# Patient Record
Sex: Female | Born: 1975 | Hispanic: No | Smoking: Never smoker
Health system: Southern US, Community
[De-identification: ages and names within clinical notes are randomized; demographics above are authoritative.]

## PROBLEM LIST (undated history)

## (undated) DIAGNOSIS — R141 Gas pain: Secondary | ICD-10-CM

## (undated) HISTORY — PX: CHOLECYSTECTOMY: SHX55

## (undated) HISTORY — DX: Gas pain: R14.1

---

## 2009-07-10 HISTORY — PX: GASTRIC BYPASS: SHX52

## 2015-10-30 DIAGNOSIS — Z8781 Personal history of (healed) traumatic fracture: Secondary | ICD-10-CM | POA: Diagnosis not present

## 2015-10-30 DIAGNOSIS — Z3202 Encounter for pregnancy test, result negative: Secondary | ICD-10-CM | POA: Insufficient documentation

## 2015-10-30 DIAGNOSIS — Z87828 Personal history of other (healed) physical injury and trauma: Secondary | ICD-10-CM | POA: Diagnosis not present

## 2015-10-30 DIAGNOSIS — M5441 Lumbago with sciatica, right side: Secondary | ICD-10-CM | POA: Insufficient documentation

## 2015-10-30 DIAGNOSIS — M545 Low back pain: Secondary | ICD-10-CM | POA: Diagnosis present

## 2015-10-31 ENCOUNTER — Emergency Department (HOSPITAL_COMMUNITY)
Admission: EM | Admit: 2015-10-31 | Discharge: 2015-10-31 | Disposition: A | Discharge status: Home / Self Care | Payer: Medicaid Other | Attending: Emergency Medicine | Admitting: Emergency Medicine

## 2015-10-31 ENCOUNTER — Encounter (HOSPITAL_COMMUNITY): Payer: Self-pay | Admitting: Emergency Medicine

## 2015-10-31 DIAGNOSIS — M5441 Lumbago with sciatica, right side: Secondary | ICD-10-CM

## 2015-10-31 LAB — POC URINE PREG, ED: Preg Test, Ur: NEGATIVE

## 2015-10-31 MED ORDER — CYCLOBENZAPRINE HCL 10 MG PO TABS: 10.0000 mg | ORAL_TABLET | Freq: Two times a day (BID) | ORAL | Status: DC | PRN

## 2015-10-31 MED ORDER — NAPROXEN 375 MG PO TABS: 375.0000 mg | ORAL_TABLET | Freq: Two times a day (BID) | ORAL | Status: DC

## 2015-10-31 NOTE — ED Notes (Signed)
Pt departed in NAD.  
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
## 2015-10-31 NOTE — ED Notes (Signed)
Pt. reports chronic low back pain onset November last year denies recent injury or fall .

## 2015-10-31 NOTE — Discharge Instructions (Signed)
Please read and follow all provided instructions.  Your diagnoses today include:  1. Right-sided low back pain with right-sided sciatica     Tests performed today include:  Vital signs - see below for your results today  Medications prescribed:   Take any prescribed medications only as directed.  Home care instructions:   Follow any educational materials contained in this packet  Please rest, use ice or heat on your back for the next several days  Do not lift, push, pull anything more than 10 pounds for the next week  Follow-up instructions: Please follow-up with your primary care provider in the next 1 week for further evaluation of your symptoms. I have provided a number for an orthopedic surgeon you can call for an appointment.   Return instructions:  SEEK IMMEDIATE MEDICAL ATTENTION IF YOU HAVE:  New numbness, tingling, weakness, or problem with the use of your arms or legs  Severe back pain not relieved with medications  Loss control of your bowels or bladder  Increasing pain in any areas of the body (such as chest or abdominal pain)  Shortness of breath, dizziness, or fainting.   Worsening nausea (feeling sick to your stomach), vomiting, fever, or sweats  Any other emergent concerns regarding your health   Additional Information:  Your vital signs today were: BP 108/80 mmHg   Pulse 77   Temp(Src) 98.3 F (36.8 C) (Oral)   Resp 20   Ht 5\' 2"  (1.575 m)   Wt 83.122 kg   BMI 33.51 kg/m2   SpO2 100%   LMP 10/23/2015 If your blood pressure (BP) was elevated above 135/85 this visit, please have this repeated by your doctor within one month. --------------

## 2015-11-02 ENCOUNTER — Encounter: Payer: Self-pay | Admitting: Clinical

## 2015-11-02 ENCOUNTER — Ambulatory Visit: Payer: Medicaid Other | Attending: Internal Medicine | Admitting: Internal Medicine

## 2015-11-02 ENCOUNTER — Encounter: Payer: Self-pay | Admitting: Internal Medicine

## 2015-11-02 VITALS — BP 114/78 | HR 76 | Temp 97.9°F | Wt 182.2 lb

## 2015-11-02 DIAGNOSIS — Z79899 Other long term (current) drug therapy: Secondary | ICD-10-CM | POA: Diagnosis not present

## 2015-11-02 DIAGNOSIS — M5440 Lumbago with sciatica, unspecified side: Secondary | ICD-10-CM

## 2015-11-02 DIAGNOSIS — M5441 Lumbago with sciatica, right side: Secondary | ICD-10-CM | POA: Diagnosis present

## 2015-11-02 LAB — CBC WITH DIFFERENTIAL/PLATELET
BASOS PCT: 0 %
Basophils Absolute: 0 cells/uL (ref 0–200)
EOS PCT: 0 %
Eosinophils Absolute: 0 cells/uL — ABNORMAL LOW (ref 15–500)
HCT: 33.5 % — ABNORMAL LOW (ref 35.0–45.0)
Hemoglobin: 9.8 g/dL — ABNORMAL LOW (ref 11.7–15.5)
LYMPHS PCT: 45 %
Lymphs Abs: 2475 cells/uL (ref 850–3900)
MCH: 23 pg — ABNORMAL LOW (ref 27.0–33.0)
MCHC: 29.3 g/dL — AB (ref 32.0–36.0)
MCV: 78.6 fL — ABNORMAL LOW (ref 80.0–100.0)
MONO ABS: 495 {cells}/uL (ref 200–950)
MPV: 10.1 fL (ref 7.5–12.5)
Monocytes Relative: 9 %
NEUTROS PCT: 46 %
Neutro Abs: 2530 cells/uL (ref 1500–7800)
Platelets: 373 10*3/uL (ref 140–400)
RBC: 4.26 MIL/uL (ref 3.80–5.10)
RDW: 15.8 % — AB (ref 11.0–15.0)
WBC: 5.5 10*3/uL (ref 3.8–10.8)

## 2015-11-02 LAB — BASIC METABOLIC PANEL WITH GFR
BUN: 10 mg/dL (ref 7–25)
CALCIUM: 9.3 mg/dL (ref 8.6–10.2)
CO2: 25 mmol/L (ref 20–31)
Chloride: 105 mmol/L (ref 98–110)
Creat: 0.87 mg/dL (ref 0.50–1.10)
GFR, EST NON AFRICAN AMERICAN: 83 mL/min (ref >=60)
Glucose, Bld: 89 mg/dL (ref 65–99)
Potassium: 4.6 mmol/L (ref 3.5–5.3)
SODIUM: 137 mmol/L (ref 135–146)

## 2015-11-02 LAB — HEMOGLOBIN A1C
HEMOGLOBIN A1C: 5 % (ref <5.7)
Mean Plasma Glucose: 97 mg/dL

## 2015-11-02 LAB — TSH: TSH: 0.85 m[IU]/L
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
## 2015-11-02 NOTE — Progress Notes (Signed)
[facility] 2/9 11/02/2015  Decreased Interest 2  PHQ - 2 Score 2  Altered sleeping 3  Tired, decreased energy 3  Change in appetite 3  Feeling bad or failure about yourself  2  Trouble concentrating 2  Moving slowly or fidgety/restless 2  Suicidal thoughts 0  PHQ-9 Score 17  Difficult doing work/chores Somewhat difficult    GAD 7 : Generalized Anxiety Score 11/02/2015  Nervous, Anxious, on Edge 1  Control/stop worrying 1  Worry too much - different things 2  Trouble relaxing 2  Restless 2  Easily annoyed or irritable 1  Afraid - awful might happen 0  Total GAD 7 Score 9  Anxiety Difficulty Not difficult at all   *PHQ9 total score is 19 (also 2 for "little interest or pleasure in doing things"

## 2015-11-02 NOTE — Patient Instructions (Signed)
Make appt w/  Me in couple wks for papsmear/fasting  Please - to chk cholesterol - referral specialist will call you w/ appts for pain specialist and ortho.  Back Pain, Adult Back pain is very common in adults.The cause of back pain is rarely dangerous and the pain often gets better over time.The cause of your back pain may not be known. Some common causes of back pain include: 1. Strain of the muscles or ligaments supporting the spine. 2. Wear and tear (degeneration) of the spinal disks. 3. Arthritis. 4. Direct injury to the back. For many people, back pain may return. Since back pain is rarely dangerous, most people can learn to manage this condition on their own. HOME CARE INSTRUCTIONS Watch your back pain for any changes. The following actions may help to lessen any discomfort you are feeling: 1. Remain active. It is stressful on your back to sit or stand in one place for long periods of time. Do not sit, drive, or stand in one place for more than 30 minutes at a time. Take short walks on even surfaces as soon as you are able.Try to increase the length of time you walk each day. 2. Exercise regularly as directed by your health care provider. Exercise helps your back heal faster. It also helps avoid future injury by keeping your muscles strong and flexible. 3. Do not stay in bed.Resting more than 1-2 days can delay your recovery. 4. Pay attention to your body when you bend and lift. The most comfortable positions are those that put less stress on your recovering back. Always use proper lifting techniques, including: 1. Bending your knees. 2. Keeping the load close to your body. 3. Avoiding twisting. 5. Find a comfortable position to sleep. Use a firm mattress and lie on your side with your knees slightly bent. If you lie on your back, put a pillow under your knees. 6. Avoid feeling anxious or stressed.Stress increases muscle tension and can worsen back pain.It is important to recognize  when you are anxious or stressed and learn ways to manage it, such as with exercise. 7. Take medicines only as directed by your health care provider. Over-the-counter medicines to reduce pain and inflammation are often the most helpful.Your health care provider may prescribe muscle relaxant drugs.These medicines help dull your pain so you can more quickly return to your normal activities and healthy exercise. 8. Apply ice to the injured area: 1. Put ice in a plastic bag. 2. Place a towel between your skin and the bag. 3. Leave the ice on for 20 minutes, 2-3 times a day for the first 2-3 days. After that, ice and heat may be alternated to reduce pain and spasms. 9. Maintain a healthy weight. Excess weight puts extra stress on your back and makes it difficult to maintain good posture. SEEK MEDICAL CARE IF: 1. You have pain that is not relieved with rest or medicine. 2. You have increasing pain going down into the legs or buttocks. 3. You have pain that does not improve in one week. 4. You have night pain. 5. You lose weight. 6. You have a fever or chills. SEEK IMMEDIATE MEDICAL CARE IF:  1. You develop new bowel or bladder control problems. 2. You have unusual weakness or numbness in your arms or legs. 3. You develop nausea or vomiting. 4. You develop abdominal pain. 5. You feel faint.   This information is not intended to replace advice given to you by your health care provider.  Make sure you discuss any questions you have with your health care provider.   Document Released: 06/26/2005 Document Revised: 07/17/2014 Document Reviewed: 10/28/2013 Elsevier Interactive Patient Education 2016 [employer]  - Back Exercises If you have pain in your back, do these exercises 2-3 times each day or as told by your doctor. When the pain goes away, do the exercises once each day, but repeat the steps more times for each exercise (do more repetitions). If you do not have pain in your back, do these  exercises once each day or as told by your doctor. EXERCISES Single Knee to Chest Do these steps 3-5 times in a row for each leg: 5. Lie on your back on a firm bed or the floor with your legs stretched out. 6. Bring one knee to your chest. 7. Hold your knee to your chest by grabbing your knee or thigh. 8. Pull on your knee until you feel a gentle stretch in your lower back. 9. Keep doing the stretch for 10-30 seconds. 10. Slowly let go of your leg and straighten it. Pelvic Tilt Do these steps 5-10 times in a row: 10. Lie on your back on a firm bed or the floor with your legs stretched out. 11. Bend your knees so they point up to the ceiling. Your feet should be flat on the floor. 12. Tighten your lower belly (abdomen) muscles to press your lower back against the floor. This will make your tailbone point up to the ceiling instead of pointing down to your feet or the floor. 13. Stay in this position for 5-10 seconds while you gently tighten your muscles and breathe evenly. Cat-Cow Do these steps until your lower back bends more easily: 7. Get on your hands and knees on a firm surface. Keep your hands under your shoulders, and keep your knees under your hips. You may put padding under your knees. 8. Let your head hang down, and make your tailbone point down to the floor so your lower back is round like the back of a cat. 9. Stay in this position for 5 seconds. 10. Slowly lift your head and make your tailbone point up to the ceiling so your back hangs low (sags) like the back of a cow. 11. Stay in this position for 5 seconds. Press-Ups Do these steps 5-10 times in a row: 6. Lie on your belly (face-down) on the floor. 7. Place your hands near your head, about shoulder-width apart. 8. While you keep your back relaxed and keep your hips on the floor, slowly straighten your arms to raise the top half of your body and lift your shoulders. Do not use your back muscles. To make yourself more  comfortable, you may change where you place your hands. 9. Stay in this position for 5 seconds. 10. Slowly return to lying flat on the floor. Bridges Do these steps 10 times in a row: 1. Lie on your back on a firm surface. 2. Bend your knees so they point up to the ceiling. Your feet should be flat on the floor. 3. Tighten your butt muscles and lift your butt off of the floor until your waist is almost as high as your knees. If you do not feel the muscles working in your butt and the back of your thighs, slide your feet 1-2 inches farther away from your butt. 4. Stay in this position for 3-5 seconds. 5. Slowly lower your butt to the floor, and let your butt muscles relax. If  this exercise is too easy, try doing it with your arms crossed over your chest. Belly Crunches Do these steps 5-10 times in a row: 1. Lie on your back on a firm bed or the floor with your legs stretched out. 2. Bend your knees so they point up to the ceiling. Your feet should be flat on the floor. 3. Cross your arms over your chest. 4. Tip your chin a little bit toward your chest but do not bend your neck. 5. Tighten your belly muscles and slowly raise your chest just enough to lift your shoulder blades a tiny bit off of the floor. 6. Slowly lower your chest and your head to the floor. Back Lifts Do these steps 5-10 times in a row: 1. Lie on your belly (face-down) with your arms at your sides, and rest your forehead on the floor. 2. Tighten the muscles in your legs and your butt. 3. Slowly lift your chest off of the floor while you keep your hips on the floor. Keep the back of your head in line with the curve in your back. Look at the floor while you do this. 4. Stay in this position for 3-5 seconds. 5. Slowly lower your chest and your face to the floor. GET HELP IF:  Your back pain gets a lot worse when you do an exercise.  Your back pain does not lessen 2 hours after you exercise. If you have any of these  problems, stop doing the exercises. Do not do them again unless your doctor says it is okay. GET HELP RIGHT AWAY IF:  You have sudden, very bad back pain. If this happens, stop doing the exercises. Do not do them again unless your doctor says it is okay.   This information is not intended to replace advice given to you by your health care provider. Make sure you discuss any questions you have with your health care provider.   Document Released: 07/29/2010 Document Revised: 03/17/2015 Document Reviewed: 08/20/2014 Elsevier Interactive Patient Education [employer] Else[employer] Inc[employer] -

## 2015-11-03 ENCOUNTER — Other Ambulatory Visit: Payer: Self-pay | Admitting: Internal Medicine

## 2015-11-03 DIAGNOSIS — M5441 Lumbago with sciatica, right side: Secondary | ICD-10-CM

## 2015-11-03 DIAGNOSIS — M5442 Lumbago with sciatica, left side: Principal | ICD-10-CM

## 2015-11-03 LAB — VITAMIN D 25 HYDROXY (VIT D DEFICIENCY, FRACTURES): VIT D 25 HYDROXY: 12 ng/mL — AB (ref 30–100)

## 2015-11-03 MED ORDER — VITAMIN D (ERGOCALCIFEROL) 1.25 MG (50000 UNIT) PO CAPS: 50000.0000 [IU] | ORAL_CAPSULE | ORAL | Status: DC

## 2015-11-03 MED ORDER — FERROUS GLUCONATE 324 (38 FE) MG PO TABS: 324.0000 mg | ORAL_TABLET | Freq: Two times a day (BID) | ORAL | Status: DC

## 2015-11-08 ENCOUNTER — Telehealth: Payer: Self-pay | Admitting: Internal Medicine

## 2015-11-08 NOTE — Telephone Encounter (Signed)
Patient states PCP was referring patient to ortho and Pain Clinic..patient states she is still in pain....please follow up

## 2015-11-15 ENCOUNTER — Ambulatory Visit (HOSPITAL_BASED_OUTPATIENT_CLINIC_OR_DEPARTMENT_OTHER): Payer: Medicaid Other | Admitting: Internal Medicine

## 2015-11-15 ENCOUNTER — Encounter: Payer: Self-pay | Admitting: Internal Medicine

## 2015-11-15 ENCOUNTER — Ambulatory Visit (HOSPITAL_COMMUNITY)
Admission: RE | Admit: 2015-11-15 | Discharge: 2015-11-15 | Disposition: A | Discharge status: Home / Self Care | Payer: Medicaid Other | Source: Ambulatory Visit | Attending: Internal Medicine | Admitting: Internal Medicine

## 2015-11-15 VITALS — BP 103/69 | HR 78 | Temp 98.2°F | Resp 18 | Ht 62.0 in | Wt 183.6 lb

## 2015-11-15 DIAGNOSIS — M5441 Lumbago with sciatica, right side: Secondary | ICD-10-CM | POA: Diagnosis not present

## 2015-11-15 DIAGNOSIS — D509 Iron deficiency anemia, unspecified: Secondary | ICD-10-CM

## 2015-11-15 DIAGNOSIS — M4856XA Collapsed vertebra, not elsewhere classified, lumbar region, initial encounter for fracture: Secondary | ICD-10-CM | POA: Diagnosis not present

## 2015-11-15 DIAGNOSIS — R29890 Loss of height: Secondary | ICD-10-CM | POA: Diagnosis not present

## 2015-11-15 MED ORDER — DICLOFENAC SODIUM 1 % TD GEL: 2.0000 g | Freq: Four times a day (QID) | TRANSDERMAL | Status: DC

## 2015-11-15 NOTE — Progress Notes (Signed)
Patient is here for FU  Patient complains of lower back pain being present for months. Scaled currently at a 9. Pain is described as throbbing and burning.  Patient has not taken medication today. Patient has not eaten.

## 2015-11-15 NOTE — Progress Notes (Signed)
Deborah Hogan, is a 40 y.o. female  [id number]  [id number]  DOB - [birth year]  Chief Complaint  Patient presents with  . Follow-up        Subjective:   Deborah Hogan is a 40 y.o. female here today for a follow up visit for back pain.  Quite uncomfortable at night w/ lower back pain, has difficulty sleeping, meds are not helping, trying warm heating pad at night to get rest.  Pain described constant in low back, w/ radiation/"burning" down right leg.  Denies loss of urine/stool.  Has been taking her iron pills tid, no signs of constipation.  Currently on her menses, so unable to do papsmear.   Patient has No headache, No chest pain, No abdominal pain - No Nausea, No new weakness tingling or numbness, No Cough - SOB.  Problem  Microcytic Anemia  Midline Low Back Pain With Right-Sided Sciatica    ALLERGIES: No Known Allergies  PAST MEDICAL HISTORY: No past medical history on file.  MEDICATIONS AT HOME: Prior to Admission medications   Medication Sig Start Date End Date Taking? Authorizing Provider  cyclobenzaprine (FLEXERIL) 10 MG tablet Take 1 tablet (10 mg total) by mouth 2 (two) times daily as needed for muscle spasms. Patient not taking: Reported on 11/02/2015 10/31/15   Audry Pili, PA-C  diclofenac sodium (VOLTAREN) 1 % GEL Apply 2 g topically 4 (four) times daily. 11/15/15   Pete Glatter, MD  ferrous gluconate (FERGON) 324 MG tablet Take 1 tablet (324 mg total) by mouth 2 (two) times daily with a meal. 11/03/15   Pete Glatter, MD  naproxen (NAPROSYN) 375 MG tablet Take 1 tablet (375 mg total) by mouth 2 (two) times daily with a meal. Patient not taking: Reported on 11/02/2015 10/31/15   Audry Pili, PA-C  Vitamin D, Ergocalciferol, (DRISDOL) 50000 units CAPS capsule Take 1 capsule (50,000 Units total) by mouth every 7 (seven) days. 11/03/15   Pete Glatter, MD     Objective:   Filed Vitals:   11/15/15 0934  BP: 103/69  Pulse: 78  Temp: 98.2 F  (36.8 C)  TempSrc: Oral  Resp: 18  Height:  (1.575 m)  Weight: 183 lb 9.6 oz (83.28 kg)  SpO2: 100%    Exam General appearance : Awake, alert, not in any distress. Speech Clear. Not toxic looking, morbid obese. HEENT: Atraumatic and Normocephalic, pupils equally reactive to light. Neck: supple, no JVD. Chest: Good air entry bilaterally, no added sounds. CVS: S1 S2 regular, no murmurs/gallups or rubs. Abdomen: Bowel sounds active, Non tender and not distended with no gaurding, rigidity or rebound. MS: ttp low lumbar region, l10, bilat paraspinus w/ reproducible sciatic pain down left leg.   Extremities: B/L Lower Ext shows no edema, both legs are warm to touch Neurology: Awake alert, and oriented X 3, CN II-XII grossly intact, Non focal Skin:No Rash  Data Review Lab Results  Component Value Date   HGBA1C 5.0 11/02/2015    Depression screen PHQ 2/9 11/02/2015  Decreased Interest 2  PHQ - 2 Score 2  Altered sleeping 3  Tired, decreased energy 3  Change in appetite 3  Feeling bad or failure about yourself  2  Trouble concentrating 2  Moving slowly or fidgety/restless 2  Suicidal thoughts 0  PHQ-9 Score 17  Difficult doing work/chores Somewhat difficult      Assessment & Plan   1. Microcytic anemia - currently on iron tid, encouraged to continue. - Iron, TIBC and  Ferritin Panel  2. Midline low back pain with right-sided sciatica - ortho and pain magmt referrals have been approved, pending appt times. - DG Lumbar Spine Complete; Future - today. - back stretches /info on sciatica given  3. papsmear - next time, currently on menses.  Patient have been counseled extensively about nutrition and exercise  Return in about 3 months (around 02/15/2016), or if symptoms worsen or fail to improve. pending papsmear  The patient was given clear instructions to go to ER or return to medical center if symptoms don't improve, worsen or new problems develop. The patient  verbalized understanding. The patient was told to call to get lab results if they haven't heard anything in the next week.    Pete Glatterawn T Kylar Leonhardt, MD, MBA/MHA [facility] [place], [place] [phone]   11/15/2015, 9:34 AM

## 2015-11-15 NOTE — Patient Instructions (Addendum)
Iron Deficiency Anemia, Adult Anemia is when you have a low number of healthy red blood cells. It is often caused by too little iron. This is called iron deficiency anemia. It may make you tired and short of breath. HOME CARE  1. Take iron as told by your doctor. 2. Take vitamins as told by your doctor. 3. Eat foods that have iron in them. This includes liver, lean beef, whole-grain bread, eggs, dried fruit, and dark green leafy vegetables. GET HELP RIGHT AWAY IF: 1. You pass out (faint). 2. You have chest pain. 3. You feel sick to your stomach (nauseous) or throw up (vomit). 4. You get very short of breath with activity. 5. You are weak. 6. You have a fast heartbeat. 7. You start to sweat for no reason. 8. You become light-headed when getting up from a chair or bed. MAKE SURE YOU: 1. Understand these instructions. 2. Will watch your condition. 3. Will get help right away if you are not doing well or get worse.   This information is not intended to replace advice given to you by your health care provider. Make sure you discuss any questions you have with your health care provider.   Document Released: 07/29/2010 Document Revised: 07/17/2014 Document Reviewed: 03/03/2013 Elsevier Interactive Patient Education 2016 Elsevier Inc.  - Sciatica Sciatica is pain, weakness, numbness, or tingling along your sciatic nerve. The nerve starts in the lower back and runs down the back of each leg. Nerve damage or certain conditions pinch or put pressure on the sciatic nerve. This causes the pain, weakness, and other discomforts of sciatica. HOME CARE  4. Only take medicine as told by your doctor. 5. Apply ice to the affected area for 20 minutes. Do this 3-4 times a day for the first 48-72 hours. Then try heat in the same way. 6. Exercise, stretch, or do your usual activities if these do not make your pain worse. 7. Go to physical therapy as told by your doctor. 8. Keep all doctor visits as  told. 9. Do not wear high heels or shoes that are not supportive. 10. Get a firm mattress if your mattress is too soft to lessen pain and  -discomfort. GET HELP RIGHT AWAY IF:  9. You cannot control when you poop (bowel movement) or pee (urinate). 10. You have more weakness in your lower back, lower belly (pelvis), butt (buttocks), or legs. 11. You have redness or puffiness (swelling) of your back. 12. You have a burning feeling when you pee. 13. You have pain that gets worse when you lie down. 14. You have pain that wakes you from your sleep. 15. Your pain is worse than past pain. 16. Your pain lasts longer than 4 weeks. 17. You are suddenly losing weight without reason. MAKE SURE YOU:  4. Understand these instructions. 5. Will watch this condition. 6. Will get help right away if you are not doing well or get worse.   This information is not intended to replace advice given to you by your health care provider. Make sure you discuss any questions you have with your health care provider.   Document Released: 04/04/2008 Document Revised: 03/17/2015 Document Reviewed: 11/05/2011 Elsevier Interactive Patient Education 2016 Else[employer] Inc.   - Back Exercises If you have pain in your back, do these exercises 2-3 times each day or as told by your doctor. When the pain goes away, do the exercises once each day, but repeat the steps more times for each exercise (do  more repetitions). If you do not have pain in your back, do these exercises once each day or as told by your doctor. EXERCISES Single Knee to Chest Do these steps 3-5 times in a row for each leg: 11. Lie on your back on a firm bed or the floor with your legs stretched out. 12. Bring one knee to your chest. 13. Hold your knee to your chest by grabbing your knee or thigh. 14. Pull on your knee until you feel a gentle stretch in your lower back. 15. Keep doing the stretch for 10-30 seconds. 16. Slowly let go of your leg and  straighten it. Pelvic Tilt Do these steps 5-10 times in a row: 18. Lie on your back on a firm bed or the floor with your legs stretched out. 19. Bend your knees so they point up to the ceiling. Your feet should be flat on the floor. 20. Tighten your lower belly (abdomen) muscles to press your lower back against the floor. This will make your tailbone point up to the ceiling instead of pointing down to your feet or the floor. 21. Stay in this position for 5-10 seconds while you gently tighten your muscles and breathe evenly. Cat-Cow Do these steps until your lower back bends more easily: 7. Get on your hands and knees on a firm surface. Keep your hands under your shoulders, and keep your knees under your hips. You may put padding under your knees. 8. Let your head hang down, and make your tailbone point down to the floor so your lower back is round like the back of a cat. 9. Stay in this position for 5 seconds. 10. Slowly lift your head and make your tailbone point up to the ceiling so your back hangs low (sags) like the back of a cow. 11. Stay in this position for 5 seconds. Press-Ups Do these steps 5-10 times in a row: 1. Lie on your belly (face-down) on the floor. 2. Place your hands near your head, about shoulder-width apart. 3. While you keep your back relaxed and keep your hips on the floor, slowly straighten your arms to raise the top half of your body and lift your shoulders. Do not use your back muscles. To make yourself more comfortable, you may change where you place your hands. 4. Stay in this position for 5 seconds. 5. Slowly return to lying flat on the floor. Bridges Do these steps 10 times in a row: 1. Lie on your back on a firm surface. 2. Bend your knees so they point up to the ceiling. Your feet should be flat on the floor. 3. Tighten your butt muscles and lift your butt off of the floor until your waist is almost as high as your knees. If you do not feel the muscles working  in your butt and the back of your thighs, slide your feet 1-2 inches farther away from your butt. 4. Stay in this position for 3-5 seconds. 5. Slowly lower your butt to the floor, and let your butt muscles relax. If this exercise is too easy, try doing it with your arms crossed over your chest. Belly Crunches Do these steps 5-10 times in a row: 1. Lie on your back on a firm bed or the floor with your legs stretched out. 2. Bend your knees so they point up to the ceiling. Your feet should be flat on the floor. 3. Cross your arms over your chest. 4. Tip your chin a little bit toward  your chest but do not bend your neck. 5. Tighten your belly muscles and slowly raise your chest just enough to lift your shoulder blades a tiny bit off of the floor. 6. Slowly lower your chest and your head to the floor. Back Lifts Do these steps 5-10 times in a row: 1. Lie on your belly (face-down) with your arms at your sides, and rest your forehead on the floor. 2. Tighten the muscles in your legs and your butt. 3. Slowly lift your chest off of the floor while you keep your hips on the floor. Keep the back of your head in line with the curve in your back. Look at the floor while you do this. 4. Stay in this position for 3-5 seconds. 5. Slowly lower your chest and your face to the floor. GET HELP IF:  Your back pain gets a lot worse when you do an exercise.  Your back pain does not lessen 2 hours after you exercise. If you have any of these problems, stop doing the exercises. Do not do them again unless your doctor says it is okay. GET HELP RIGHT AWAY IF:  You have sudden, very bad back pain. If this happens, stop doing the exercises. Do not do them again unless your doctor says it is okay.   This information is not intended to replace advice given to you by your health care provider. Make sure you discuss any questions you have with your health care provider.   Document Released: 07/29/2010 Document  Revised: 03/17/2015 Document Reviewed: 08/20/2014 Elsevier Interactive Patient Education [employer]

## 2015-11-16 ENCOUNTER — Other Ambulatory Visit: Payer: Self-pay | Admitting: Internal Medicine

## 2015-11-16 LAB — IRON,TIBC AND FERRITIN PANEL
%SAT: 3 % — AB (ref 11–50)
FERRITIN: 7 ng/mL — AB (ref 10–232)
Iron: 15 ug/dL — ABNORMAL LOW (ref 40–190)
TIBC: 470 ug/dL — AB (ref 250–450)

## 2015-11-16 MED ORDER — FERROUS GLUCONATE 324 (38 FE) MG PO TABS: 324.0000 mg | ORAL_TABLET | Freq: Three times a day (TID) | ORAL | Status: DC

## 2015-11-16 NOTE — Telephone Encounter (Signed)
Will forward to PCP 

## 2015-11-17 NOTE — Telephone Encounter (Signed)
Clld pt - LMOVTC re referrals.    Please advise pt when she clls back that referrals have been processed and that she should be anticipating a [facility] from [facility] [phone] and [facility] [phone].

## 2015-11-17 NOTE — Telephone Encounter (Signed)
Please tell pt that the referrals have been made.  Looking at referral notes from Cheryll DessertNora Soler:  1/ Pain specialist: Sent Referral to [facility] Ph. # [phone] .They will contact the patient to schedule an appointment.  2. Ortho/ [facility] Ph. # [phone]. Address [address] They will contact the patient to schedule an appointment.

## 2015-11-30 ENCOUNTER — Telehealth: Payer: Self-pay | Admitting: *Deleted

## 2015-11-30 NOTE — Telephone Encounter (Signed)
-----   Message from Pete Glatterawn T Langeland, MD sent at 11/16/2015  9:14 AM EDT ----- Please tell pt she if VERY iron def and to take the iron pills tid (rather than bid). Will rechk levels in 2-3 months. Thanks.

## 2015-11-30 NOTE — Telephone Encounter (Signed)
Patient verified DOB Patient is aware of Xray not being good. Patient has been contacted by [facility] and [facility]. Offices will contact patient once paperwork is received to schedule initial appointment. No further questions at this time.

## 2015-11-30 NOTE — Telephone Encounter (Signed)
-----   Message from Pete Glatterawn T Langeland, MD sent at 11/16/2015 10:19 AM EDT ----- Please tell pt her xrays don't look good, expected given what she told me on her MRIs. Please help w/ appts for pain md and ortho ASap. thanks

## 2015-11-30 NOTE — Telephone Encounter (Signed)
Patient verified DOB Patient is aware of iron being very low and needing to take Iron supplement TID. Patient is aware of recheck being completed in 2-3 months.

## 2016-01-18 ENCOUNTER — Emergency Department (HOSPITAL_COMMUNITY)
Admission: EM | Admit: 2016-01-18 | Discharge: 2016-01-19 | Disposition: A | Discharge status: Home / Self Care | Payer: Medicaid Other | Attending: Emergency Medicine | Admitting: Emergency Medicine

## 2016-01-18 ENCOUNTER — Encounter (HOSPITAL_COMMUNITY): Payer: Self-pay | Admitting: Emergency Medicine

## 2016-01-18 DIAGNOSIS — D649 Anemia, unspecified: Secondary | ICD-10-CM | POA: Diagnosis not present

## 2016-01-18 DIAGNOSIS — R4182 Altered mental status, unspecified: Secondary | ICD-10-CM | POA: Diagnosis not present

## 2016-01-18 DIAGNOSIS — R55 Syncope and collapse: Secondary | ICD-10-CM

## 2016-01-18 LAB — CBC WITH DIFFERENTIAL/PLATELET
Basophils Absolute: 0 10*3/uL (ref 0.0–0.1)
Basophils Relative: 0 %
EOS PCT: 0 %
Eosinophils Absolute: 0 10*3/uL (ref 0.0–0.7)
HCT: 29.4 % — ABNORMAL LOW (ref 36.0–46.0)
Hemoglobin: 8.5 g/dL — ABNORMAL LOW (ref 12.0–15.0)
LYMPHS ABS: 2.1 10*3/uL (ref 0.7–4.0)
LYMPHS PCT: 33 %
MCH: 22.8 pg — AB (ref 26.0–34.0)
MCHC: 28.9 g/dL — ABNORMAL LOW (ref 30.0–36.0)
MCV: 78.8 fL (ref 78.0–100.0)
MONOS PCT: 3 %
Monocytes Absolute: 0.2 10*3/uL (ref 0.1–1.0)
Neutro Abs: 4 10*3/uL (ref 1.7–7.7)
Neutrophils Relative %: 64 %
Platelets: 338 10*3/uL (ref 150–400)
RBC: 3.73 MIL/uL — AB (ref 3.87–5.11)
RDW: 18.1 % — ABNORMAL HIGH (ref 11.5–15.5)
WBC: 6.3 10*3/uL (ref 4.0–10.5)

## 2016-01-18 LAB — I-STAT CHEM 8, ED
BUN: 8 mg/dL (ref 6–20)
CHLORIDE: 103 mmol/L (ref 101–111)
CREATININE: 0.8 mg/dL (ref 0.44–1.00)
Calcium, Ion: 1.13 mmol/L (ref 1.13–1.30)
GLUCOSE: 121 mg/dL — AB (ref 65–99)
HCT: 31 % — ABNORMAL LOW (ref 36.0–46.0)
Hemoglobin: 10.5 g/dL — ABNORMAL LOW (ref 12.0–15.0)
POTASSIUM: 3.1 mmol/L — AB (ref 3.5–5.1)
Sodium: 142 mmol/L (ref 135–145)
TCO2: 23 mmol/L (ref 0–100)

## 2016-01-18 LAB — PROTIME-INR
INR: 1.17 (ref 0.00–1.49)
Prothrombin Time: 15.1 seconds (ref 11.6–15.2)

## 2016-01-18 MED ORDER — SODIUM CHLORIDE 0.9 % IV BOLUS (SEPSIS)
1000.0000 mL | Freq: Once | INTRAVENOUS | Status: AC
  Administered 2016-01-18: 1000 mL via INTRAVENOUS

## 2016-01-18 NOTE — ED Notes (Signed)
Patient at home with family and had syncopal episode. EMS arrives, patient in floor, responsive to pain stimuli. Unable to follow commands. Patient has hx of gallbladder surgery in past. 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 Family told EMS she took pain pill earlier at 2 pm. Patient stated to EMS stomach hurts. HR 130, 86/34, CBG 141, 100% room air. 18 g in place LAC via EMS. EMS gave 400 ml fluids and 50 mg Benadryl for suspected allergic reaction. BP on arrival 129/83, HR 116, resp 16, 100 room air.

## 2016-01-18 NOTE — ED Provider Notes (Signed)
Medical screening examination/treatment/procedure(s) were conducted as a shared visit with non-physician practitioner(s) and myself.  I personally evaluated the patient during the encounter.   EKG Interpretation None      This is a 40 year old female who presents following an episode of loss of consciousness. Per the family, patient had just eaten dinner. She reported feeling hot. She got up and "passed out." No seizure activity noted. On EMS arrival she was tachycardic with a heart rate 130s initial blood pressure 86/34. CBG 141. Patient was given fluids and 50 mg Benadryl for questionable allergic reaction as patient ate crab for dinner and she has a shrimp allergy. No noted respiratory distress or rash. Patient is very somnolent on exam. She is oriented 3. Nonfocal. Unclear seizure versus syncope. No obvious signs of allergic reaction. Vital signs only notable for mild tachycardia. Will initiate workup. EKG, CT head, and metabolites.  Workup largely reassuring. Patient ambulatory without difficulty. Discharge with seizure precautions.  Shon Batonourtney F Dexton Zwilling, MD 01/19/16 [phone]

## 2016-01-18 NOTE — ED Notes (Signed)
Family [place] 3 days and patient hasn't had much sleep.

## 2016-01-18 NOTE — ED Provider Notes (Signed)
CSN: [id number]     Arrival date & time 01/18/16  2254 History   None    Chief Complaint  Patient presents with  . Loss of Consciousness  . Altered Mental Status     (Consider location/radiation/quality/duration/timing/severity/associated sxs/prior Treatment) HPI   Level V caveat- Altered Mental Status  Patient has a PMH of gastric bypass and cholecystectomy. She also takes Percocet 10 mg for pain management of chronic back pain- last took at dose around 2pm. She has an allergy to shrimp.  Patient brought to the ER by EMS for syncope vs anaphylaxis. EMS gave 50 mg IV Benadryl and fluids on arrival. Per family members the patient went and got crab cakes this afternoon and felt sick shortly after eating them. Per her daughter she went to get some air and then passed out, incontinent of urine, bilateral upper extremity tremor. No abnormal breathing. She was unconscious for 5-10 minutes per family member until EMS tried to arouse her. The patient is currently drowsy, denies knowing what happened but endorses having abdominal pain. The family member reports similar symptoms in December 2016 and the problem was that she needed her gallbladder removed.  No past medical history on file. Past Surgical History  Procedure Laterality Date  . Cholecystectomy    . Gastric bypass  2011   No family history on file. Social History  Substance Use Topics  . Smoking status: Never Smoker   . Smokeless tobacco: None  . Alcohol Use: No   OB History    No data available     Review of Systems  Review of Systems All other systems negative except as documented in the HPI. All pertinent positives and negatives as reviewed in the HPI.   Allergies  Review of patient's allergies indicates no known allergies.  Home Medications   Prior to Admission medications   Medication Sig Start Date End Date Taking? Authorizing Provider  oxyCODONE-acetaminophen (PERCOCET) 10-325 MG tablet Take 1 tablet by mouth  every 4 (four) hours as needed for pain.   Yes Historical Provider, MD  Vitamin D, Ergocalciferol, (DRISDOL) 50000 units CAPS capsule Take 1 capsule (50,000 Units total) by mouth every 7 (seven) days. 11/03/15  Yes Pete Glatter, MD   BP 108/74 mmHg  Pulse 97  Temp(Src) 97.6 F (36.4 C) (Oral)  Resp 20  SpO2 100%  LMP 01/10/2016 Physical Exam  Constitutional: She appears well-developed and well-nourished. She appears ill. No distress. She is sedated.  HENT:  Head: Normocephalic and atraumatic. Head is without abrasion, without contusion, without laceration, without right periorbital erythema and without left periorbital erythema.  Right Ear: Tympanic membrane and ear canal normal.  Left Ear: Tympanic membrane and ear canal normal.  Nose: Nose normal.  Mouth/Throat: Uvula is midline and oropharynx is clear and moist.  Eyes: Pupils are equal, round, and reactive to light.  Neck: Neck supple. No spinous process tenderness and no muscular tenderness present.  Cardiovascular: Regular rhythm.  Tachycardia present.   Pulmonary/Chest: Effort normal and breath sounds normal. She has no decreased breath sounds. She has no wheezes.  Abdominal: Soft. There is tenderness (diffuse moderate.). There is no rigidity, no rebound, no guarding and no CVA tenderness.  Musculoskeletal:  Right arm tremor.  Neurological:  Pt responds only to painful stimuli, She responds to simple commands, has motor function of bilateral extremities. Decreased sensation to bilateral feet.  Skin: Skin is warm and dry. No rash noted.  Nursing note and vitals reviewed.   ED Course  Procedures (including critical care time) Labs Review Labs Reviewed  COMPREHENSIVE METABOLIC PANEL - Abnormal; Notable for the following:    Potassium 3.1 (*)    Glucose, Bld 123 (*)    Calcium 8.6 (*)    ALT 12 (*)    All other components within normal limits  CBC WITH DIFFERENTIAL/PLATELET - Abnormal; Notable for the following:    RBC  3.73 (*)    Hemoglobin 8.5 (*)    HCT 29.4 (*)    MCH 22.8 (*)    MCHC 28.9 (*)    RDW 18.1 (*)    All other components within normal limits  I-STAT CHEM 8, ED - Abnormal; Notable for the following:    Potassium 3.1 (*)    Glucose, Bld 121 (*)    Hemoglobin 10.5 (*)    HCT 31.0 (*)    All other components within normal limits  ETHANOL  PROTIME-INR  POCT CBG (FASTING - GLUCOSE)-MANUAL ENTRY    Imaging Review Ct Head Wo Contrast  01/19/2016  CLINICAL DATA:  40 year old female with syncope EXAM: CT HEAD WITHOUT CONTRAST CT CERVICAL SPINE WITHOUT CONTRAST TECHNIQUE: Multidetector CT imaging of the head and cervical spine was performed following the standard protocol without intravenous contrast. Multiplanar CT image reconstructions of the cervical spine were also generated. COMPARISON:  None. FINDINGS: CT HEAD FINDINGS The ventricles and the sulci are appropriate in size for the patient's age. There is no intracranial hemorrhage. No midline shift or mass effect identified. The gray-white matter differentiation is preserved. The visualized paranasal sinuses and mastoid air cells are well aerated. The calvarium is intact. CT CERVICAL SPINE FINDINGS There is no acute fracture or subluxation of the cervical spine.The intervertebral disc spaces are preserved.The odontoid and spinous processes are intact.There is normal anatomic alignment of the C1-C2 lateral masses. The visualized soft tissues appear unremarkable. IMPRESSION: No acute intracranial pathology. No acute/ traumatic cervical spine pathology. Electronically Signed   By: Elgie Collard M.D.   On: 01/19/2016 00:46   Ct Cervical Spine Wo Contrast  01/19/2016  CLINICAL DATA:  40 year old female with syncope EXAM: CT HEAD WITHOUT CONTRAST CT CERVICAL SPINE WITHOUT CONTRAST TECHNIQUE: Multidetector CT imaging of the head and cervical spine was performed following the standard protocol without intravenous contrast. Multiplanar CT image  reconstructions of the cervical spine were also generated. COMPARISON:  None. FINDINGS: CT HEAD FINDINGS The ventricles and the sulci are appropriate in size for the patient's age. There is no intracranial hemorrhage. No midline shift or mass effect identified. The gray-white matter differentiation is preserved. The visualized paranasal sinuses and mastoid air cells are well aerated. The calvarium is intact. CT CERVICAL SPINE FINDINGS There is no acute fracture or subluxation of the cervical spine.The intervertebral disc spaces are preserved.The odontoid and spinous processes are intact.There is normal anatomic alignment of the C1-C2 lateral masses. The visualized soft tissues appear unremarkable. IMPRESSION: No acute intracranial pathology. No acute/ traumatic cervical spine pathology. Electronically Signed   By: Elgie Collard M.D.   On: 01/19/2016 00:46   I have personally reviewed and evaluated these images and lab results as part of my medical decision-making.   EKG Interpretation   Date/Time:  Wednesday January 19 2016 02:15:22 EDT Ventricular Rate:  96 PR Interval:    QRS Duration: 82 QT Interval:  337 QTC Calculation: 426 R Axis:   63 Text Interpretation:  Sinus rhythm Borderline T abnormalities, anterior  leads Confirmed by HORTON  MD, COURTNEY ([id number]) on 01/19/2016 2:19:13 AM  MDM   Final diagnoses:  Syncope, non cardiac   11:14 pm - case discussed with Dr. Wilkie AyeHorton who is going to share the patient visit.  CRITICAL CARE Performed by: Dorthula MatasGREENE,Tywanna Seifer G Total critical care time: 35 minutes Critical care time was exclusive of separately billable procedures and treating other patients. Critical care was necessary to treat or prevent imminent or life-threatening deterioration. Critical care was time spent personally by me on the following activities: development of treatment plan with patient and/or surrogate as well as nursing, discussions with consultants, evaluation of  patient's response to treatment, examination of patient, obtaining history from patient or surrogate, ordering and performing treatments and interventions, ordering and review of laboratory studies, ordering and review of radiographic studies, pulse oximetry and re-evaluation of patient's condition.  The patient had neck CT are unremarkable, her Chem-8, CMP, CBC, PT/INR and EtOH also and it showed no acute changes. The patient's hemoglobin is 8.5 but this is close to her baseline of 9.2. She will need to follow-up with her primary care doctor regarding this.  The patient has returned back to baseline, likely her drowsiness is due to the Benadryl. Discussed the case with Dr. Wilkie AyeHorton who believes she likely had a syncopal episode favored every having seizure. She is walked to the bathroom without any assistance, has tolerated by mouth without any difficulty. The family members and the patient are asking when they can go home and would like to be discharged. They've been given strict return to emergency precautions and agreed to follow-up with her primary care doctor.   Marlon Peliffany Bliss Behnke, PA-C 01/19/16 0301  Shon Batonourtney F Horton, MD 01/20/16 [phone]

## 2016-01-19 ENCOUNTER — Emergency Department (HOSPITAL_COMMUNITY): Payer: Medicaid Other

## 2016-01-19 LAB — COMPREHENSIVE METABOLIC PANEL
ALBUMIN: 3.5 g/dL (ref 3.5–5.0)
ALK PHOS: 94 U/L (ref 38–126)
ALT: 12 U/L — AB (ref 14–54)
AST: 21 U/L (ref 15–41)
Anion gap: 6 (ref 5–15)
BILIRUBIN TOTAL: 0.6 mg/dL (ref 0.3–1.2)
BUN: 9 mg/dL (ref 6–20)
CO2: 24 mmol/L (ref 22–32)
Calcium: 8.6 mg/dL — ABNORMAL LOW (ref 8.9–10.3)
Chloride: 108 mmol/L (ref 101–111)
Creatinine, Ser: 0.83 mg/dL (ref 0.44–1.00)
GFR calc Af Amer: >60 mL/min (ref >60)
GFR calc non Af Amer: >60 mL/min (ref >60)
GLUCOSE: 123 mg/dL — AB (ref 65–99)
POTASSIUM: 3.1 mmol/L — AB (ref 3.5–5.1)
Sodium: 138 mmol/L (ref 135–145)
TOTAL PROTEIN: 7 g/dL (ref 6.5–8.1)

## 2016-01-19 LAB — ETHANOL: Alcohol, Ethyl (B): <5 mg/dL (ref <5)

## 2016-01-19 NOTE — ED Notes (Signed)
Pt departed in NAD.  

## 2016-01-19 NOTE — ED Notes (Signed)
Patient ambulated to bathroom with assistance.

## 2016-01-19 NOTE — Discharge Instructions (Signed)

## 2016-01-19 NOTE — ED Notes (Signed)
Drink and crackers given 

## 2016-09-26 ENCOUNTER — Emergency Department (HOSPITAL_COMMUNITY)
Admission: EM | Admit: 2016-09-26 | Discharge: 2016-09-26 | Disposition: A | Discharge status: Home / Self Care | Payer: Medicaid Other | Attending: Emergency Medicine | Admitting: Emergency Medicine

## 2016-09-26 ENCOUNTER — Encounter (HOSPITAL_COMMUNITY): Payer: Self-pay | Admitting: Emergency Medicine

## 2016-09-26 DIAGNOSIS — K2970 Gastritis, unspecified, without bleeding: Secondary | ICD-10-CM | POA: Insufficient documentation

## 2016-09-26 DIAGNOSIS — K2960 Other gastritis without bleeding: Secondary | ICD-10-CM

## 2016-09-26 LAB — COMPREHENSIVE METABOLIC PANEL
ALT: 36 U/L (ref 14–54)
AST: 25 U/L (ref 15–41)
Albumin: 3.6 g/dL (ref 3.5–5.0)
Alkaline Phosphatase: 151 U/L — ABNORMAL HIGH (ref 38–126)
Anion gap: 9 (ref 5–15)
BILIRUBIN TOTAL: 0.3 mg/dL (ref 0.3–1.2)
CALCIUM: 8.8 mg/dL — AB (ref 8.9–10.3)
CO2: 26 mmol/L (ref 22–32)
CREATININE: 0.69 mg/dL (ref 0.44–1.00)
Chloride: 101 mmol/L (ref 101–111)
GFR calc Af Amer: >60 mL/min (ref >60)
Glucose, Bld: 89 mg/dL (ref 65–99)
Potassium: 3.2 mmol/L — ABNORMAL LOW (ref 3.5–5.1)
Sodium: 136 mmol/L (ref 135–145)
TOTAL PROTEIN: 8 g/dL (ref 6.5–8.1)

## 2016-09-26 LAB — URINALYSIS, ROUTINE W REFLEX MICROSCOPIC
Bilirubin Urine: NEGATIVE
GLUCOSE, UA: NEGATIVE mg/dL
Hgb urine dipstick: NEGATIVE
KETONES UR: NEGATIVE mg/dL
LEUKOCYTES UA: NEGATIVE
NITRITE: NEGATIVE
PROTEIN: NEGATIVE mg/dL
Specific Gravity, Urine: 1.003 — ABNORMAL LOW (ref 1.005–1.030)
pH: 7 (ref 5.0–8.0)

## 2016-09-26 LAB — LIPASE, BLOOD: Lipase: 16 U/L (ref 11–51)

## 2016-09-26 LAB — CBC
HCT: 31 % — ABNORMAL LOW (ref 36.0–46.0)
Hemoglobin: 9 g/dL — ABNORMAL LOW (ref 12.0–15.0)
MCH: 22.2 pg — ABNORMAL LOW (ref 26.0–34.0)
MCHC: 29 g/dL — ABNORMAL LOW (ref 30.0–36.0)
MCV: 76.4 fL — ABNORMAL LOW (ref 78.0–100.0)
PLATELETS: 443 10*3/uL — AB (ref 150–400)
RBC: 4.06 MIL/uL (ref 3.87–5.11)
RDW: 17.7 % — AB (ref 11.5–15.5)
WBC: 7 10*3/uL (ref 4.0–10.5)

## 2016-09-26 LAB — POC URINE PREG, ED: Preg Test, Ur: NEGATIVE

## 2016-09-26 MED ORDER — ONDANSETRON 4 MG PO TBDP
4.0000 mg | ORAL_TABLET | Freq: Once | ORAL | Status: AC | PRN
  Administered 2016-09-26: 4 mg via ORAL

## 2016-09-26 MED ORDER — OMEPRAZOLE 20 MG PO CPDR: DELAYED_RELEASE_CAPSULE | ORAL | 0 refills | Status: DC

## 2016-09-26 MED ORDER — GI COCKTAIL ~~LOC~~
30.0000 mL | Freq: Once | ORAL | Status: AC
  Administered 2016-09-26: 30 mL via ORAL
  Filled 2016-09-26: qty 30

## 2016-09-26 MED ORDER — ONDANSETRON 4 MG PO TBDP
ORAL_TABLET | ORAL | Status: AC
  Filled 2016-09-26: qty 1

## 2016-09-26 NOTE — ED Triage Notes (Signed)
Pt to ED from home c/o generalized abdominal pain radiating to back and L side with N/V/D x 3 days. Pt states she's taken muscle relaxants without relief. Reports fevers at home (last 102F yesterday, no fever today). Pt denies any new urinary symptoms.

## 2016-09-26 NOTE — ED Provider Notes (Signed)
[facility]EM[facility]Y DEPT Provider Note   CSN: [id number] Arrival date & time: 09/26/16  0032     History   Chief Complaint Chief Complaint  Patient presents with  . Abdominal Pain    HPI Deborah Hogan is a 41 y.o. female.  Patient presents with epigastric abdominal pain that is described as burning, radiates to the back, and at times comes up to the chest and throat. No fever, vomiting. She reports symptoms have been present since her cholecystectomy in 2016. Tonight she reports she just couldn't take the pain anymore. She has not tried anything for symptoms. She denies melena or bloody stools.   The history is provided by the patient. No language interpreter was used.  Abdominal Pain   Associated symptoms include nausea. Pertinent negatives include fever and vomiting.    History reviewed. No pertinent past medical history.  Patient Active Problem List   Diagnosis Date Noted  . Microcytic anemia 11/15/2015  . Midline low back pain with right-sided sciatica 11/15/2015    Past Surgical History:  Procedure Laterality Date  . CHOLECYSTECTOMY    . GASTRIC BYPASS  2011    OB History    No data available       Home Medications    Prior to Admission medications   Medication Sig Start Date End Date Taking? Authorizing Provider  oxyCODONE-acetaminophen (PERCOCET) 10-325 MG tablet Take 1 tablet by mouth every 4 (four) hours as needed for pain.    Historical Provider, MD  Vitamin D, Ergocalciferol, (DRISDOL) 50000 units CAPS capsule Take 1 capsule (50,000 Units total) by mouth every 7 (seven) days. 11/03/15   Pete Glatter, MD    Family History No family history on file.  Social History Social History  Substance Use Topics  . Smoking status: Never Smoker  . Smokeless tobacco: Never Used  . Alcohol use Yes     Comment: wine occ     Allergies   Patient has no known allergies.   Review of Systems Review of Systems  Constitutional: Negative for chills and  fever.  Respiratory: Negative.  Negative for shortness of breath.   Cardiovascular: Negative.  Negative for chest pain.  Gastrointestinal: Positive for abdominal pain and nausea. Negative for blood in stool and vomiting.  Genitourinary: Negative.   Musculoskeletal: Negative.   Neurological: Negative.      Physical Exam Updated Vital Signs BP 118/75   Pulse 81   Temp 97.4 F (36.3 C) (Oral)   Resp 18   Ht 5\' 2"  (1.575 m)   Wt 88.5 kg   LMP 09/15/2016   SpO2 99%   BMI 35.67 kg/m   Physical Exam  Constitutional: She is oriented to person, place, and time. She appears well-developed and well-nourished.  HENT:  Head: Normocephalic.  Neck: Normal range of motion. Neck supple.  Cardiovascular: Normal rate and regular rhythm.   Pulmonary/Chest: Effort normal and breath sounds normal.  Abdominal: Soft. Bowel sounds are normal. There is no tenderness. There is no rebound and no guarding.  Musculoskeletal: Normal range of motion. She exhibits no edema.  Neurological: She is alert and oriented to person, place, and time.  Skin: Skin is warm and dry. No rash noted.  Psychiatric: She has a normal mood and affect.     ED Treatments / Results  Labs (all labs ordered are listed, but only abnormal results are displayed) Labs Reviewed  COMPREHENSIVE METABOLIC PANEL - Abnormal; Notable for the following:       Result Value  Potassium 3.2 (*)    BUN <5 (*)    Calcium 8.8 (*)    Alkaline Phosphatase 151 (*)    All other components within normal limits  CBC - Abnormal; Notable for the following:    Hemoglobin 9.0 (*)    HCT 31.0 (*)    MCV 76.4 (*)    MCH 22.2 (*)    MCHC 29.0 (*)    RDW 17.7 (*)    Platelets 443 (*)    All other components within normal limits  URINALYSIS, ROUTINE W REFLEX MICROSCOPIC - Abnormal; Notable for the following:    Color, Urine STRAW (*)    Specific Gravity, Urine 1.003 (*)    All other components within normal limits  LIPASE, BLOOD  POC URINE  PREG, ED    EKG  EKG Interpretation None       Radiology No results found.  Procedures Procedures (including critical care time)  Medications Ordered in ED Medications  ondansetron (ZOFRAN-ODT) 4 MG disintegrating tablet (not administered)  ondansetron (ZOFRAN-ODT) disintegrating tablet 4 mg (4 mg Oral Given 09/26/16 0110)  gi cocktail (Maalox,Lidocaine,Donnatal) (30 mLs Oral Given 09/26/16 0349)     Initial Impression / Assessment and Plan / ED Course  I have reviewed the triage vital signs and the nursing notes.  Pertinent labs & imaging results that were available during my care of the patient were reviewed by me and considered in my medical decision making (see chart for details).     Patient presents with c/o epigastric, burning type abdominal pain x 2 years. She has not taken anything for symptoms. She describes increased gas and belching, radiation to the back and throat, c/w reflux gastritis. Labs show mild anemia that appears baseline. VSS. Symptoms better with GI cocktail. Feel symptoms are related to reflux and are not cardiac or pulmonary in nature. She can be discharged home with GI referral and daily prilosec. Return precautions discussed.   Final Clinical Impressions(s) / ED Diagnoses   Final diagnoses:  None   1. Reflux   New Prescriptions New Prescriptions   No medications on file     Elpidio AnisShari Nakea Gouger, Cordelia Poche-C 09/26/16 0416    Dione Boozeavid Glick, MD 09/26/16 [phone]

## 2016-09-29 ENCOUNTER — Emergency Department (HOSPITAL_COMMUNITY)
Admission: EM | Admit: 2016-09-29 | Discharge: 2016-09-29 | Disposition: A | Discharge status: Home / Self Care | Payer: Medicaid Other | Attending: Emergency Medicine | Admitting: Emergency Medicine

## 2016-09-29 ENCOUNTER — Encounter (HOSPITAL_COMMUNITY): Payer: Self-pay | Admitting: *Deleted

## 2016-09-29 ENCOUNTER — Emergency Department (HOSPITAL_COMMUNITY)
Admission: EM | Admit: 2016-09-29 | Discharge: 2016-09-30 | Disposition: A | Discharge status: Home / Self Care | Payer: Medicaid Other | Attending: Emergency Medicine | Admitting: Emergency Medicine

## 2016-09-29 DIAGNOSIS — K2950 Unspecified chronic gastritis without bleeding: Secondary | ICD-10-CM | POA: Insufficient documentation

## 2016-09-29 DIAGNOSIS — E876 Hypokalemia: Secondary | ICD-10-CM

## 2016-09-29 DIAGNOSIS — K5900 Constipation, unspecified: Secondary | ICD-10-CM | POA: Insufficient documentation

## 2016-09-29 LAB — URINALYSIS, ROUTINE W REFLEX MICROSCOPIC
BILIRUBIN URINE: NEGATIVE
Bacteria, UA: NONE SEEN
Glucose, UA: NEGATIVE mg/dL
KETONES UR: 20 mg/dL — AB
LEUKOCYTES UA: NEGATIVE
Nitrite: NEGATIVE
PH: 5 (ref 5.0–8.0)
Protein, ur: 100 mg/dL — AB
Specific Gravity, Urine: 1.035 — ABNORMAL HIGH (ref 1.005–1.030)

## 2016-09-29 LAB — COMPREHENSIVE METABOLIC PANEL
ALK PHOS: 150 U/L — AB (ref 38–126)
ALT: 29 U/L (ref 14–54)
ALT: 30 U/L (ref 14–54)
ANION GAP: 10 (ref 5–15)
AST: 33 U/L (ref 15–41)
AST: 33 U/L (ref 15–41)
Albumin: 4.4 g/dL (ref 3.5–5.0)
Albumin: 4.2 g/dL (ref 3.5–5.0)
Alkaline Phosphatase: 143 U/L — ABNORMAL HIGH (ref 38–126)
Anion gap: 14 (ref 5–15)
BILIRUBIN TOTAL: 0.5 mg/dL (ref 0.3–1.2)
BILIRUBIN TOTAL: 0.4 mg/dL (ref 0.3–1.2)
BUN: 9 mg/dL (ref 6–20)
BUN: 7 mg/dL (ref 6–20)
CALCIUM: 9.5 mg/dL (ref 8.9–10.3)
CO2: 24 mmol/L (ref 22–32)
CO2: 24 mmol/L (ref 22–32)
CREATININE: 0.8 mg/dL (ref 0.44–1.00)
CREATININE: 0.84 mg/dL (ref 0.44–1.00)
Calcium: 9.7 mg/dL (ref 8.9–10.3)
Chloride: 99 mmol/L — ABNORMAL LOW (ref 101–111)
Chloride: 98 mmol/L — ABNORMAL LOW (ref 101–111)
GFR calc Af Amer: >60 mL/min (ref >60)
Glucose, Bld: 126 mg/dL — ABNORMAL HIGH (ref 65–99)
Glucose, Bld: 113 mg/dL — ABNORMAL HIGH (ref 65–99)
POTASSIUM: 2.9 mmol/L — AB (ref 3.5–5.1)
Potassium: 2.7 mmol/L — CL (ref 3.5–5.1)
SODIUM: 136 mmol/L (ref 135–145)
Sodium: 133 mmol/L — ABNORMAL LOW (ref 135–145)
TOTAL PROTEIN: 9.2 g/dL — AB (ref 6.5–8.1)
Total Protein: 8.6 g/dL — ABNORMAL HIGH (ref 6.5–8.1)

## 2016-09-29 LAB — CBC
HCT: 33.7 % — ABNORMAL LOW (ref 36.0–46.0)
HCT: 33.8 % — ABNORMAL LOW (ref 36.0–46.0)
HEMOGLOBIN: 10.2 g/dL — AB (ref 12.0–15.0)
Hemoglobin: 10.3 g/dL — ABNORMAL LOW (ref 12.0–15.0)
MCH: 22.5 pg — AB (ref 26.0–34.0)
MCH: 22.8 pg — ABNORMAL LOW (ref 26.0–34.0)
MCHC: 30.3 g/dL (ref 30.0–36.0)
MCHC: 30.5 g/dL (ref 30.0–36.0)
MCV: 74.4 fL — ABNORMAL LOW (ref 78.0–100.0)
MCV: 74.8 fL — ABNORMAL LOW (ref 78.0–100.0)
PLATELETS: 532 10*3/uL — AB (ref 150–400)
PLATELETS: 517 10*3/uL — AB (ref 150–400)
RBC: 4.53 MIL/uL (ref 3.87–5.11)
RBC: 4.52 MIL/uL (ref 3.87–5.11)
RDW: 16.9 % — ABNORMAL HIGH (ref 11.5–15.5)
RDW: 16.9 % — AB (ref 11.5–15.5)
WBC: 8.8 10*3/uL (ref 4.0–10.5)
WBC: 7.9 10*3/uL (ref 4.0–10.5)

## 2016-09-29 LAB — LIPASE, BLOOD
LIPASE: 12 U/L (ref 11–51)
Lipase: 20 U/L (ref 11–51)

## 2016-09-29 MED ORDER — GI COCKTAIL ~~LOC~~
30.0000 mL | Freq: Once | ORAL | Status: AC
  Administered 2016-09-29: 30 mL via ORAL
  Filled 2016-09-29: qty 30

## 2016-09-29 MED ORDER — POTASSIUM CHLORIDE CRYS ER 20 MEQ PO TBCR
40.0000 meq | EXTENDED_RELEASE_TABLET | Freq: Once | ORAL | Status: DC
  Filled 2016-09-29: qty 2

## 2016-09-29 MED ORDER — SODIUM CHLORIDE 0.9 % IV BOLUS (SEPSIS)
250.0000 mL | Freq: Once | INTRAVENOUS | Status: AC
  Administered 2016-09-29: 250 mL via INTRAVENOUS

## 2016-09-29 MED ORDER — ONDANSETRON 4 MG PO TBDP
4.0000 mg | ORAL_TABLET | Freq: Once | ORAL | Status: AC
  Administered 2016-09-29: 4 mg via ORAL
  Filled 2016-09-29: qty 1

## 2016-09-29 NOTE — ED Triage Notes (Signed)
Per EMS, pt states she has not had bowel movement x 5 days and right sided abdominal pain. Pt states she was given prilosec but has been unable to keep down. Pt had fever of 100.2, pt given 1000mg  tylenol en route to hospital.   Pt was hyperventilating with EMS.  BP 124/80 HR 104 SpO2 99%

## 2016-09-29 NOTE — ED Notes (Addendum)
Pt continues to refuse PO medications, states "I'm dehydrated. Can't the hospital see that I'm dehydrated? I need an IV." NP made aware.

## 2016-09-29 NOTE — ED Notes (Signed)
Bed: [facility] Expected date:  Expected time:  Means of arrival:  Comments: 

## 2016-09-29 NOTE — ED Notes (Signed)
[facility] Staff incorrectly [facility] . This chart will be closed and a new visit started.

## 2016-09-29 NOTE — ED Notes (Signed)
Pt called again with no answer.  Tech has checked the waiting room and bathroom.  Pt not found.

## 2016-09-29 NOTE — ED Provider Notes (Signed)
[facility] Provider Note   CSN: [id number] Arrival date & time: 09/29/16  1816     History   Chief Complaint Chief Complaint  Patient presents with  . Abdominal Pain  . Constipation    HPI Deborah Hogan is a 41 y.o. female.  SP gastric bipass and Hx of GERD      History reviewed. No pertinent past medical history.  Patient Active Problem List   Diagnosis Date Noted  . Microcytic anemia 11/15/2015  . Midline low back pain with right-sided sciatica 11/15/2015    Past Surgical History:  Procedure Laterality Date  . CHOLECYSTECTOMY    . GASTRIC BYPASS  2011    OB History    No data available       Home Medications    Prior to Admission medications   Medication Sig Start Date End Date Taking? Authorizing Provider  Magnesium Hydroxide (MILK OF MAGNESIA PO) Take 15 mLs by mouth daily as needed (indigestion/ stomach pain).   Yes Historical Provider, MD  tiZANidine (ZANAFLEX) 4 MG tablet Take 4 mg by mouth at bedtime as needed for muscle spasms.   Yes Historical Provider, MD  omeprazole (PRILOSEC) 20 MG capsule Take one tablet twice daily for the next 7 days, then once daily after that. Patient not taking: Reported on 09/29/2016 09/26/16   Elpidio AnisShari Upstill, PA-C  potassium chloride (K-DUR) 10 MEQ tablet Take 1 tablet (10 mEq total) by mouth daily. 09/30/16   Earley FavorGail Anola Mcgough, NP  sucralfate (CARAFATE) 1 GM/10ML suspension Take 10 mLs (1 g total) by mouth 4 (four) times daily -  with meals and at bedtime. 09/30/16   Earley FavorGail Heydy Montilla, NP  Vitamin D, Ergocalciferol, (DRISDOL) 50000 units CAPS capsule Take 1 capsule (50,000 Units total) by mouth every 7 (seven) days. Patient not taking: Reported on 09/29/2016 11/03/15   Pete Glatterawn T Langeland, MD    Family History History reviewed. No pertinent family history.  Social History Social History  Substance Use Topics  . Smoking status: Never Smoker  . Smokeless tobacco: Never Used  . Alcohol use Yes     Comment: wine occ      Allergies   Patient has no known allergies.   Review of Systems Review of Systems  Gastrointestinal: Positive for abdominal pain, nausea and vomiting.  All other systems reviewed and are negative.    Physical Exam Updated Vital Signs BP 129/84   Pulse 87   Temp 98.9 F (37.2 C) (Oral)   Resp 17   LMP 09/15/2016   SpO2 100%   Physical Exam  Constitutional: She appears well-developed and well-nourished. No distress.  Eyes: Pupils are equal, round, and reactive to light.  Neck: Normal range of motion.  Cardiovascular: Normal rate.   Pulmonary/Chest: Effort normal.  Abdominal: She exhibits no distension and no mass. There is no tenderness. There is no rebound.  Neurological: She is alert.  Skin: Skin is warm.  Nursing note and vitals reviewed.    ED Treatments / Results  Labs (all labs ordered are listed, but only abnormal results are displayed) Labs Reviewed  COMPREHENSIVE METABOLIC PANEL - Abnormal; Notable for the following:       Result Value   Potassium 2.9 (*)    Chloride 98 (*)    Glucose, Bld 113 (*)    Total Protein 8.6 (*)    Alkaline Phosphatase 143 (*)    All other components within normal limits  CBC - Abnormal; Notable for the following:    Hemoglobin 10.3 (*)  HCT 33.8 (*)    MCV 74.8 (*)    MCH 22.8 (*)    RDW 16.9 (*)    Platelets 517 (*)    All other components within normal limits  URINALYSIS, ROUTINE W REFLEX MICROSCOPIC - Abnormal; Notable for the following:    Color, Urine AMBER (*)    APPearance HAZY (*)    Specific Gravity, Urine 1.035 (*)    Hgb urine dipstick SMALL (*)    Ketones, ur 20 (*)    Protein, ur 100 (*)    Squamous Epithelial / LPF 6-30 (*)    All other components within normal limits  LIPASE, BLOOD    EKG  EKG Interpretation None       Radiology No results found.  Procedures Procedures (including critical care time)  Medications Ordered in ED Medications  gi cocktail (Maalox,Lidocaine,Donnatal)  (30 mLs Oral Given 09/29/16 2230)  ondansetron (ZOFRAN-ODT) disintegrating tablet 4 mg (4 mg Oral Given 09/29/16 2229)  sodium chloride 0.9 % bolus 250 mL (0 mLs Intravenous Stopped 09/29/16 2229)     Initial Impression / Assessment and Plan / ED Course  I have reviewed the triage vital signs and the nursing notes.  Pertinent labs & imaging results that were available during my care of the patient were reviewed by me and considered in my medical decision making (see chart for details).      Insisted on receiving IV fluids.  She was given a 500 cc bolus as well as IV Zofran, GI cocktail Was feeling better.  She was able to tolerate fluids discharging her with a prescription for Carafate and GI follow-up  Final Clinical Impressions(s) / ED Diagnoses   Final diagnoses:  Chronic gastritis without bleeding, unspecified gastritis type  Hypokalemia    New Prescriptions Discharge Medication List as of 09/30/2016 12:05 AM    START taking these medications   Details  potassium chloride (K-DUR) 10 MEQ tablet Take 1 tablet (10 mEq total) by mouth daily., Starting Sat 09/30/2016, Print    sucralfate (CARAFATE) 1 GM/10ML suspension Take 10 mLs (1 g total) by mouth 4 (four) times daily -  with meals and at bedtime., Starting Sat 09/30/2016, Print         Earley Favor, NP 10/02/16 2003    Jerelyn Scott, MD 10/05/16 [phone]

## 2016-09-29 NOTE — ED Notes (Signed)
Pt called x2 for room no response from lobby

## 2016-09-29 NOTE — ED Triage Notes (Signed)
Pt recently being seen for burning abd pain and was prescribed prilosec with no relief. Also reports no bowel movement since Sunday but has not tried any laxatives.

## 2016-09-29 NOTE — ED Notes (Signed)
Informed Charge RN of pt's critical lab value and attempts to call pt for a room.  Charge RN attempted to call pt to have her come back.

## 2016-09-29 NOTE — ED Provider Notes (Signed)
[facility]-EMERGENCY DEPT Provider Note   CSN: [id number] Arrival date & time: 09/29/16  1816     History   Chief Complaint Chief Complaint  Patient presents with  . Abdominal Pain  . Constipation    HPI Deborah Hogan is a 41 y.o. female.   41 year old female who is status post cholecystectomy and gastric bypass who presents for the second time in 2 days with complaints of epigastric burning that starts at the pit of her stomach and goes up into her throat.  She was diagnosed with reflux and prescribed Prilosec which she states has not helped.  She is also complaining of nausea, intermittent hot spells and constipation, but states that she did have a bowel movement since arriving in  the emergency department      History reviewed. No pertinent past medical history.  Patient Active Problem List   Diagnosis Date Noted  . Microcytic anemia 11/15/2015  . Midline low back pain with right-sided sciatica 11/15/2015    Past Surgical History:  Procedure Laterality Date  . CHOLECYSTECTOMY    . GASTRIC BYPASS  2011    OB History    No data available       Home Medications    Prior to Admission medications   Medication Sig Start Date End Date Taking? Authorizing Provider  Magnesium Hydroxide (MILK OF MAGNESIA PO) Take 15 mLs by mouth daily as needed (indigestion/ stomach pain).   Yes Historical Provider, MD  tiZANidine (ZANAFLEX) 4 MG tablet Take 4 mg by mouth at bedtime as needed for muscle spasms.   Yes Historical Provider, MD  omeprazole (PRILOSEC) 20 MG capsule Take one tablet twice daily for the next 7 days, then once daily after that. Patient not taking: Reported on 09/29/2016 09/26/16   Elpidio AnisShari Upstill, PA-C  potassium chloride (K-DUR) 10 MEQ tablet Take 1 tablet (10 mEq total) by mouth daily. 09/30/16   Deborah FavorGail Keaghan Staton, NP  sucralfate (CARAFATE) 1 GM/10ML suspension Take 10 mLs (1 g total) by mouth 4 (four) times daily -  with meals and at bedtime. 09/30/16   Deborah FavorGail Novaleigh Kohlman, NP    Vitamin D, Ergocalciferol, (DRISDOL) 50000 units CAPS capsule Take 1 capsule (50,000 Units total) by mouth every 7 (seven) days. Patient not taking: Reported on 09/29/2016 11/03/15   Deborah Glatterawn T Langeland, MD    Family History History reviewed. No pertinent family history.  Social History Social History  Substance Use Topics  . Smoking status: Never Smoker  . Smokeless tobacco: Never Used  . Alcohol use Yes     Comment: wine occ     Allergies   Patient has no known allergies.   Review of Systems Review of Systems  Constitutional: Positive for appetite change and fever.  Respiratory: Negative for cough.   Cardiovascular: Negative for chest pain.  Gastrointestinal: Positive for abdominal pain, nausea and vomiting.  Genitourinary: Negative for dysuria.     Physical Exam Updated Vital Signs BP 124/78   Pulse 76   Temp 98.9 F (37.2 C) (Oral)   Resp 16   LMP 09/15/2016   SpO2 100%   Physical Exam  Constitutional: She appears well-developed and well-nourished.  HENT:  Head: Normocephalic.  Eyes: Pupils are equal, round, and reactive to light.  Neck: Normal range of motion.  Cardiovascular: Normal rate and regular rhythm.   Pulmonary/Chest: Effort normal.  Abdominal: Soft. She exhibits no distension. There is tenderness in the right upper quadrant, epigastric area and left upper quadrant. There is no rigidity and no  guarding.    Musculoskeletal: Normal range of motion.  Neurological: She is alert.  Skin: Skin is warm.  Psychiatric: She has a normal mood and affect.  Nursing note and vitals reviewed.    ED Treatments / Results  Labs (all labs ordered are listed, but only abnormal results are displayed) Labs Reviewed  COMPREHENSIVE METABOLIC PANEL - Abnormal; Notable for the following:       Result Value   Potassium 2.9 (*)    Chloride 98 (*)    Glucose, Bld 113 (*)    Total Protein 8.6 (*)    Alkaline Phosphatase 143 (*)    All other components within normal  limits  CBC - Abnormal; Notable for the following:    Hemoglobin 10.3 (*)    HCT 33.8 (*)    MCV 74.8 (*)    MCH 22.8 (*)    RDW 16.9 (*)    Platelets 517 (*)    All other components within normal limits  URINALYSIS, ROUTINE W REFLEX MICROSCOPIC - Abnormal; Notable for the following:    Color, Urine AMBER (*)    APPearance HAZY (*)    Specific Gravity, Urine 1.035 (*)    Hgb urine dipstick SMALL (*)    Ketones, ur 20 (*)    Protein, ur 100 (*)    Squamous Epithelial / LPF 6-30 (*)    All other components within normal limits  LIPASE, BLOOD    EKG  EKG Interpretation None       Radiology No results found.  Procedures Procedures (including critical care time)  Medications Ordered in ED Medications  gi cocktail (Maalox,Lidocaine,Donnatal) (30 mLs Oral Given 09/29/16 2230)  potassium chloride SA (K-DUR,KLOR-CON) CR tablet 40 mEq (40 mEq Oral Given 09/29/16 2230)  ondansetron (ZOFRAN-ODT) disintegrating tablet 4 mg (4 mg Oral Given 09/29/16 2229)  sodium chloride 0.9 % bolus 250 mL (0 mLs Intravenous Stopped 09/29/16 2229)     Initial Impression / Assessment and Plan / ED Course  I have reviewed the triage vital signs and the nursing notes.  Pertinent labs & imaging results that were available during my care of the patient were reviewed by me and considered in my medical decision making (see chart for details).     Labs have been reviewed.  Potassium is 2.9, which will be supplemented.  She will be given a GI cocktail and reassessed. Patient is tolerating fluids.  She was supplemented with potassium.  She been given discharge instructions, plus the addition of Carafate for her symptom control with GI referral and follow-up with her PCP for hypokalemia  Final Clinical Impressions(s) / ED Diagnoses   Final diagnoses:  Chronic gastritis without bleeding, unspecified gastritis type  Hypokalemia    New Prescriptions New Prescriptions   POTASSIUM CHLORIDE (K-DUR) 10 MEQ  TABLET    Take 1 tablet (10 mEq total) by mouth daily.   SUCRALFATE (CARAFATE) 1 GM/10ML SUSPENSION    Take 10 mLs (1 g total) by mouth 4 (four) times daily -  with meals and at bedtime.     Deborah Favor, NP 09/30/16 0008    Deborah Hogan Scott, MD 09/30/16 [phone]

## 2016-09-29 NOTE — ED Notes (Signed)
Pt not in lobby, called x3

## 2016-09-30 MED ORDER — SUCRALFATE 1 GM/10ML PO SUSP: 1.0000 g | Freq: Three times a day (TID) | ORAL | 0 refills | Status: DC

## 2016-09-30 MED ORDER — POTASSIUM CHLORIDE ER 10 MEQ PO TBCR: 10.0000 meq | EXTENDED_RELEASE_TABLET | Freq: Every day | ORAL | 0 refills | Status: DC

## 2016-09-30 NOTE — ED Notes (Signed)
Pt drank apple juice.

## 2016-09-30 NOTE — ED Notes (Signed)
Pt drank apple juice without difficulty  

## 2016-09-30 NOTE — Discharge Instructions (Signed)
Continue taking the Prilosec that was prescribed yesterday, as well as the Carafate.  He take this 30-45 minutes before meals and at bedtime.  Please call gastroenterology for an appointment for further evaluation

## 2016-09-30 NOTE — ED Notes (Signed)
Pt was told she was being discharged, pt became very angry and ran out of room, explained to pt that she could not leave with an IV in her arm, pt became very hostile and started to verbal threaten this RN, security called, pt removed own IV and left without paper work or instructions.

## 2016-12-12 ENCOUNTER — Encounter (HOSPITAL_COMMUNITY): Payer: Self-pay

## 2016-12-12 ENCOUNTER — Emergency Department (HOSPITAL_COMMUNITY): Payer: Medicaid Other

## 2016-12-12 ENCOUNTER — Emergency Department (HOSPITAL_COMMUNITY)
Admission: EM | Admit: 2016-12-12 | Discharge: 2016-12-12 | Disposition: A | Discharge status: Home / Self Care | Payer: Medicaid Other | Attending: Emergency Medicine | Admitting: Emergency Medicine

## 2016-12-12 DIAGNOSIS — Z79899 Other long term (current) drug therapy: Secondary | ICD-10-CM | POA: Insufficient documentation

## 2016-12-12 DIAGNOSIS — D649 Anemia, unspecified: Secondary | ICD-10-CM | POA: Insufficient documentation

## 2016-12-12 DIAGNOSIS — K2970 Gastritis, unspecified, without bleeding: Secondary | ICD-10-CM

## 2016-12-12 DIAGNOSIS — R1013 Epigastric pain: Secondary | ICD-10-CM

## 2016-12-12 LAB — COMPREHENSIVE METABOLIC PANEL
ALK PHOS: 105 U/L (ref 38–126)
ALT: 12 U/L — AB (ref 14–54)
AST: 24 U/L (ref 15–41)
Albumin: 3.7 g/dL (ref 3.5–5.0)
Anion gap: 8 (ref 5–15)
BILIRUBIN TOTAL: 0.2 mg/dL — AB (ref 0.3–1.2)
BUN: 6 mg/dL (ref 6–20)
CALCIUM: 8.8 mg/dL — AB (ref 8.9–10.3)
CO2: 27 mmol/L (ref 22–32)
CREATININE: 0.61 mg/dL (ref 0.44–1.00)
Chloride: 105 mmol/L (ref 101–111)
Glucose, Bld: 106 mg/dL — ABNORMAL HIGH (ref 65–99)
Potassium: 3.4 mmol/L — ABNORMAL LOW (ref 3.5–5.1)
Sodium: 140 mmol/L (ref 135–145)
TOTAL PROTEIN: 7.4 g/dL (ref 6.5–8.1)

## 2016-12-12 LAB — TYPE AND SCREEN
ABO/RH(D): O POS
Antibody Screen: NEGATIVE

## 2016-12-12 LAB — CBC
HCT: 29.5 % — ABNORMAL LOW (ref 36.0–46.0)
Hemoglobin: 8.6 g/dL — ABNORMAL LOW (ref 12.0–15.0)
MCH: 21.6 pg — ABNORMAL LOW (ref 26.0–34.0)
MCHC: 29.2 g/dL — ABNORMAL LOW (ref 30.0–36.0)
MCV: 73.9 fL — ABNORMAL LOW (ref 78.0–100.0)
PLATELETS: 427 10*3/uL — AB (ref 150–400)
RBC: 3.99 MIL/uL (ref 3.87–5.11)
RDW: 18.1 % — AB (ref 11.5–15.5)
WBC: 6.6 10*3/uL (ref 4.0–10.5)

## 2016-12-12 LAB — ABO/RH: ABO/RH(D): O POS

## 2016-12-12 LAB — POC OCCULT BLOOD, ED: FECAL OCCULT BLD: NEGATIVE

## 2016-12-12 MED ORDER — SODIUM CHLORIDE 0.9 % IV BOLUS (SEPSIS)
1000.0000 mL | Freq: Once | INTRAVENOUS | Status: AC
  Administered 2016-12-12: 1000 mL via INTRAVENOUS

## 2016-12-12 MED ORDER — PANTOPRAZOLE SODIUM 40 MG IV SOLR
40.0000 mg | Freq: Once | INTRAVENOUS | Status: AC
  Administered 2016-12-12: 40 mg via INTRAVENOUS
  Filled 2016-12-12: qty 40

## 2016-12-12 MED ORDER — ONDANSETRON 4 MG PO TBDP: 4.0000 mg | ORAL_TABLET | Freq: Three times a day (TID) | ORAL | 0 refills | Status: DC | PRN

## 2016-12-12 MED ORDER — ONDANSETRON HCL 4 MG/2ML IJ SOLN
4.0000 mg | Freq: Once | INTRAMUSCULAR | Status: AC
  Administered 2016-12-12: 4 mg via INTRAVENOUS
  Filled 2016-12-12: qty 2

## 2016-12-12 MED ORDER — GI COCKTAIL ~~LOC~~
30.0000 mL | Freq: Once | ORAL | Status: AC
  Administered 2016-12-12: 30 mL via ORAL
  Filled 2016-12-12: qty 30

## 2016-12-12 MED ORDER — STERILE WATER FOR INJECTION IJ SOLN
INTRAMUSCULAR | Status: AC
  Administered 2016-12-12: 10 mL
  Filled 2016-12-12: qty 10

## 2016-12-12 MED ORDER — PANTOPRAZOLE SODIUM 40 MG PO TBEC: 40.0000 mg | DELAYED_RELEASE_TABLET | Freq: Every day | ORAL | 0 refills | Status: DC

## 2016-12-12 NOTE — ED Notes (Signed)
Delay on vital signs ultrasound is in the room

## 2016-12-12 NOTE — ED Provider Notes (Signed)
[facility]-EMERGENCY DEPT Provider Note   CSN: [id number] Arrival date & time: 12/12/16  1609     History   Chief Complaint Chief Complaint  Patient presents with  . Abdominal Pain  . Hematemesis  . Blood In Stools    HPI Deborah RobertsYolanda Hogan is a 41 y.o. female.  The history is provided by the patient and medical records. No language interpreter was used.  Abdominal Pain   Associated symptoms include nausea and vomiting. Pertinent negatives include diarrhea and constipation.   Deborah RobertsYolanda Hogan is a 41 y.o. female  with a PMH of microcystic anemia who presents to the Emergency Department complaining of burning constant epigastric abdominal pain since last night. She has had similar off-and-on pain for the last year or so. Associated with nausea and vomiting. Had 3-4 episodes of emesis with last emesis having "streaks" of bright red blood. Not on menstrual cycle and denies vaginal bleeding. She also endorses bright red blood per rectum which she noticed with wiping. No blood noticed on stool or in the toilet bowl, just on toilet tissue. No history of blood in the stool. Occasionally drinks a few glasses of wine. No NSAID use. Has been seen in ED with epigastric pain in the past and states this is similar to those previous flares. No medications taken prior to arrival for symptoms. No alleviating or aggravating factors noted. No back pain, fevers, chest pain, shortness of breath, dysuria. Has been referred to GI but states that her insurance would not cover it or that the clinic she was referred to did not take her medicaid.  History reviewed. No pertinent past medical history.  Patient Active Problem List   Diagnosis Date Noted  . Microcytic anemia 11/15/2015  . Midline low back pain with right-sided sciatica 11/15/2015    Past Surgical History:  Procedure Laterality Date  . CHOLECYSTECTOMY    . GASTRIC BYPASS  2011    OB History    No data available       Home Medications    Prior  to Admission medications   Medication Sig Start Date End Date Taking? Authorizing Provider  folic acid (FOLVITE) 1 MG tablet Take 1 mg by mouth daily.   Yes [provider]  Multiple Vitamins-Minerals (MULTIVITAMIN ADULTS PO) Take 1 tablet by mouth daily.   Yes [provider]  PHENTERMINE HCL PO Take 1 capsule by mouth daily.   Yes [provider]  simethicone (MYLICON) 80 MG chewable tablet Chew 80 mg by mouth every 6 (six) hours as needed for flatulence.   Yes [provider]  Magnesium Hydroxide (MILK OF MAGNESIA PO) Take 15 mLs by mouth daily as needed (indigestion/ stomach pain).    [provider]  omeprazole (PRILOSEC) 20 MG capsule Take one tablet twice daily for the next 7 days, then once daily after that. Patient not taking: Reported on 09/29/2016 09/26/16   Elpidio AnisUpstill, Shari, PA-C  potassium chloride (K-DUR) 10 MEQ tablet Take 1 tablet (10 mEq total) by mouth daily. Patient not taking: Reported on 12/12/2016 09/30/16   Earley FavorSchulz, Gail, NP  sucralfate (CARAFATE) 1 GM/10ML suspension Take 10 mLs (1 g total) by mouth 4 (four) times daily -  with meals and at bedtime. Patient not taking: Reported on 12/12/2016 09/30/16   Earley FavorSchulz, Gail, NP  tiZANidine (ZANAFLEX) 4 MG tablet Take 4 mg by mouth at bedtime as needed for muscle spasms.    [provider]  Vitamin D, Ergocalciferol, (DRISDOL) 50000 units CAPS capsule Take 1  capsule (50,000 Units total) by mouth every 7 (seven) days. Patient not taking: Reported on 09/29/2016 11/03/15   Pete Glatter, MD    Family History History reviewed. No pertinent family history.  Social History Social History  Substance Use Topics  . Smoking status: Never Smoker  . Smokeless tobacco: Never Used  . Alcohol use Yes     Comment: wine occ     Allergies   Patient has no known allergies.   Review of Systems Review of Systems  Gastrointestinal: Positive for abdominal pain, anal bleeding, nausea and  vomiting. Negative for constipation and diarrhea.  All other systems reviewed and are negative.    Physical Exam Updated Vital Signs BP 122/69   Pulse 77   Temp 98.2 F (36.8 C) (Oral)   Resp (!) 21   Ht 5\' 2"  (1.575 m)   Wt 81.6 kg (180 lb)   LMP 12/04/2016   SpO2 100%   BMI 32.92 kg/m   Physical Exam  Constitutional: She is oriented to person, place, and time. She appears well-developed and well-nourished. No distress.  HENT:  Head: Normocephalic and atraumatic.  Cardiovascular: Normal rate, regular rhythm and normal heart sounds.   No murmur heard. Pulmonary/Chest: Effort normal and breath sounds normal. No respiratory distress.  Abdominal: Soft. Bowel sounds are normal. She exhibits no distension. There is tenderness (Epigastric). There is no rebound.  Musculoskeletal: She exhibits no edema.  Neurological: She is alert and oriented to person, place, and time.  Skin: Skin is warm and dry.  Nursing note and vitals reviewed.    ED Treatments / Results  Labs (all labs ordered are listed, but only abnormal results are displayed) Labs Reviewed  COMPREHENSIVE METABOLIC PANEL - Abnormal; Notable for the following:       Result Value   Potassium 3.4 (*)    Glucose, Bld 106 (*)    Calcium 8.8 (*)    ALT 12 (*)    Total Bilirubin 0.2 (*)    All other components within normal limits  CBC - Abnormal; Notable for the following:    Hemoglobin 8.6 (*)    HCT 29.5 (*)    MCV 73.9 (*)    MCH 21.6 (*)    MCHC 29.2 (*)    RDW 18.1 (*)    Platelets 427 (*)    All other components within normal limits  PH, GASTRIC FLUID (GASTROCCULT CARD)  POC OCCULT BLOOD, ED  TYPE AND SCREEN  ABO/RH    EKG  EKG Interpretation None       Radiology US Abdomen Limited Ruq  Result Date: 12/12/2016 CLINICAL DATA:  Epigastric pain x1 day. History gastric bypass and cholecystectomy. EXAM: ULTRASOUND ABDOMEN LIMITED RIGHT UPPER QUADRANT COMPARISON:  None. FINDINGS: Gallbladder:  Surgically absent Common bile duct: Diameter: 5.6 mm and normal. Liver: No focal lesion identified. Within normal limits in parenchymal echogenicity. Color Doppler flow demonstrated within the hepatic and portal veins. IMPRESSION: Status post cholecystectomy. Unremarkable right upper quadrant abdominal ultrasound otherwise. Electronically Signed   By: Tollie Eth M.D.   On: 12/12/2016 18:35    Procedures Procedures (including critical care time)  Medications Ordered in ED Medications  gi cocktail (Maalox,Lidocaine,Donnatal) (30 mLs Oral Given 12/12/16 1833)  ondansetron (ZOFRAN) injection 4 mg (4 mg Intravenous Given 12/12/16 1833)  sodium chloride 0.9 % bolus 1,000 mL (1,000 mLs Intravenous New Bag/Given 12/12/16 1812)  pantoprazole (PROTONIX) injection 40 mg (40 mg Intravenous Given 12/12/16 1900)  sterile water (preservative free) injection (10  mLs  Given 12/12/16 1900)     Initial Impression / Assessment and Plan / ED Course  I have reviewed the triage vital signs and the nursing notes.  Pertinent labs & imaging results that were available during my care of the patient were reviewed by me and considered in my medical decision making (see chart for details).    Sonja Manseau is a 41 y.o. female who presents to ED for burning epigastric abdominal pain which began last night. Seen in the emergency Department a few times in the past with likely gastritis. Patient states that symptoms today feel similar to his prior episodes. On exam, patient is nontoxic, nonseptic appearing with a non-surgical abdominal exam. Patient's pain and other symptoms have been adequately managed in the Emergency Department today. Fluid bolus given. Zofran, Gi cocktail and protonix given. Labs, imaging and vitals reviewed. No acute findings on RUQ ultrasound. Hemoccult negative. No vaginal bleeding. Patient does have hx of anemia. H&H today of 8.6/29.5. She has had similar values in this range in the past. No lightheadedness,  dizziness, fatigue. Does not appear to have symptomatic anemia. Spoke to patient about importance of PCP follow up for further evaluation and recheck of H&H in regards to anemia. Patient does not meet the SIRS or Sepsis criteria. On repeat exam, abdominal exam with no peritoneal signs. Abdominal pain resolved with medications given. She is tolerating PO with no emesis since medication administration. Patient discharged home with symptomatic treatment and encouraged to follow up with GI. We spoke at length about how I believe she would benefit from endoscopy given recurrent symptoms. She understands to call GI clinic given on referral info.  I have also discussed reasons to return immediately to the ER. Patient expresses understanding and agrees with plan as dictated above.  Patient discussed with Dr. Freida Busman who agrees with treatment plan.    Final Clinical Impressions(s) / ED Diagnoses   Final diagnoses:  Epigastric pain    New Prescriptions New Prescriptions   No medications on file     Ward, Chase Picket, Cordelia Poche 12/12/16 2011    Lorre Nick, MD 12/15/16 [phone]

## 2016-12-12 NOTE — ED Notes (Signed)
Ultrasound at bedside

## 2016-12-12 NOTE — ED Triage Notes (Signed)
Patient c/o intermittent mid upper and mid abdominal pain with N/V/D since last night. Patient states she has bright red blood in stool and emesis.

## 2016-12-12 NOTE — Discharge Instructions (Signed)
It was my pleasure taking care of you today!   Fortunately, your lab work and imaging was very reassuring.  It is very important that you follow up with a GI (stomach) doctor. Please call the clinic listed to schedule this follow up appointment. Let them know you were seen in the ER and told to follow up.   At this time, there does not appear to be an acute, emergent cause for your abdominal pain. However, this does not mean that your abdominal pain may not become an emergency in the future. It is VERY important that you monitor your symptoms and return to the Emergency Department if you develop any of the following symptoms:  The pain does not go away.  You have a fever.  You keep throwing up and can't keep fluids down. You pass bloody or black tarry stools.  You do not seem to be getting better.  You have any questions or concerns.

## 2016-12-12 NOTE — ED Notes (Signed)
RN going in pt room to start a line and collect blood work

## 2017-06-18 IMAGING — CT CT HEAD W/O CM
3 of 10 series · 13 of 47 positions shown, 16 images · non-contrast
Comparison: None.

CLINICAL DATA: 39-year-old female with syncope

EXAM:
CT HEAD WITHOUT CONTRAST
CT CERVICAL SPINE WITHOUT CONTRAST
TECHNIQUE: Multidetector CT imaging of the head and cervical spine was
performed following the standard protocol without intravenous
contrast. Multiplanar CT image reconstructions of the cervical spine
were also generated.

[Series 302: soft tissue, idose (2) · axial · 0.39mm/px · z∈[+511,+733]mm · 9 of 133 slices shown, 12 images]
[im 11/133  brain]
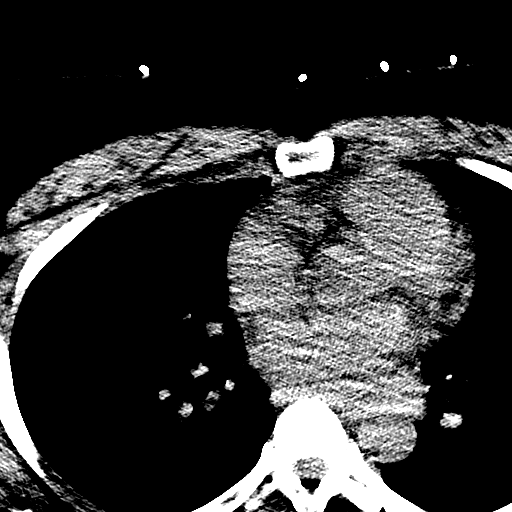
[im 11/133  bone]
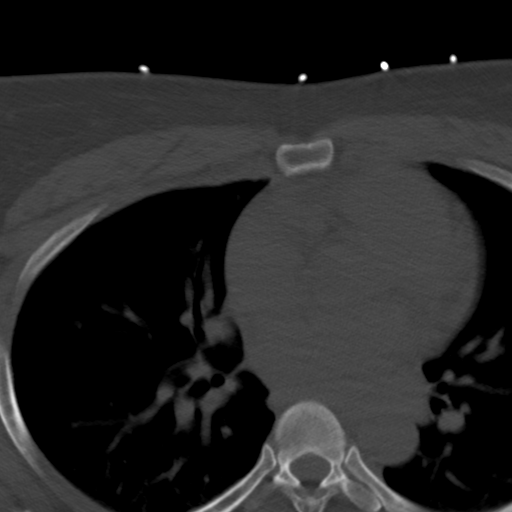
[im 31/133  brain]
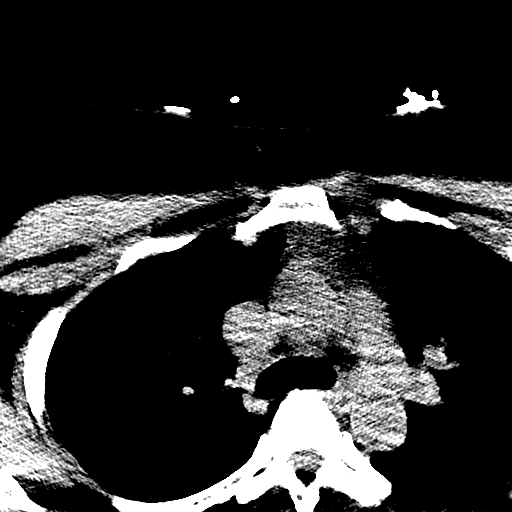
[im 41/133  brain]
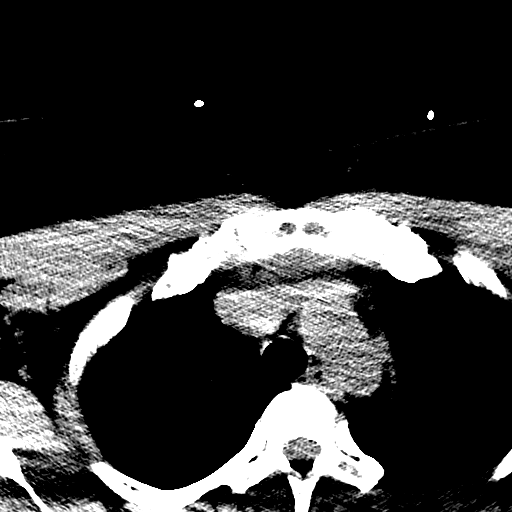
[im 51/133  brain]
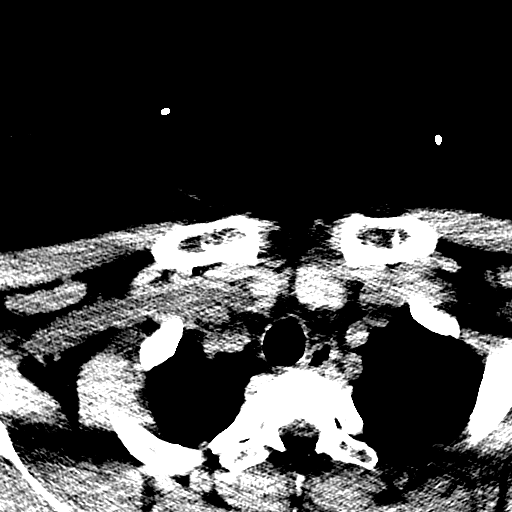
[im 72/133  brain]
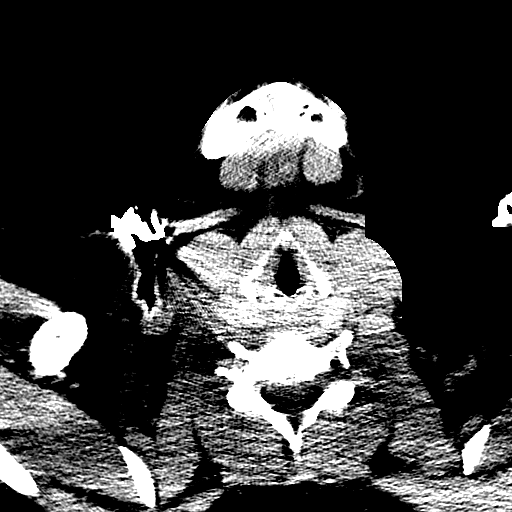
[im 72/133  bone]
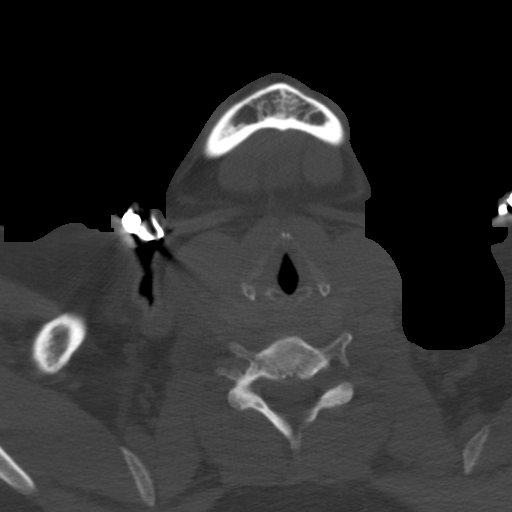
[im 82/133  brain]
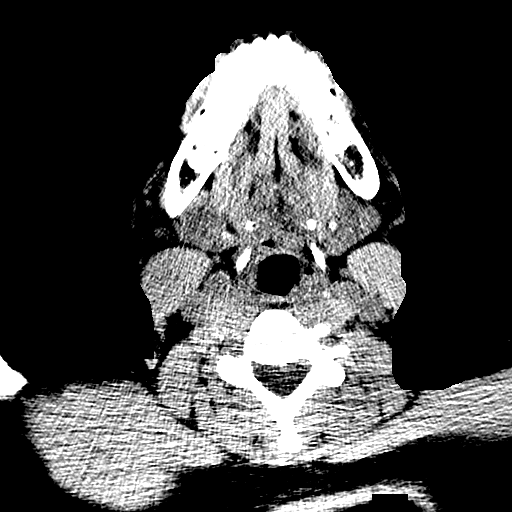
[im 92/133  brain]
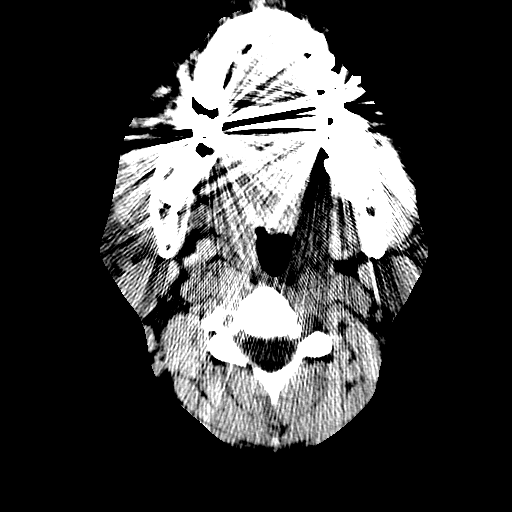
[im 112/133  brain]
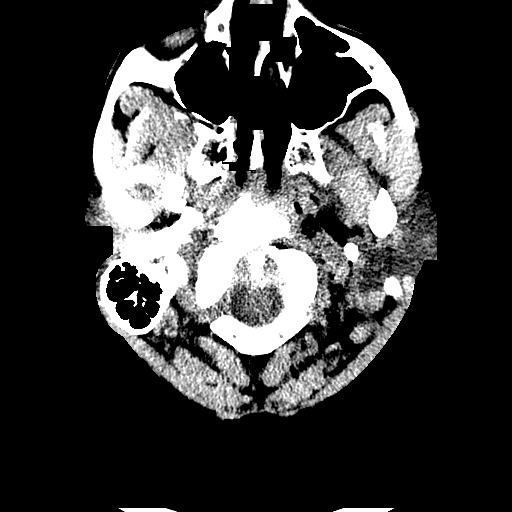
[im 122/133  brain]
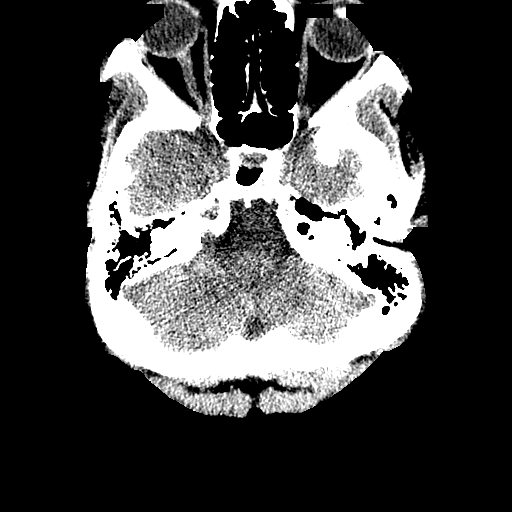
[im 122/133  bone]
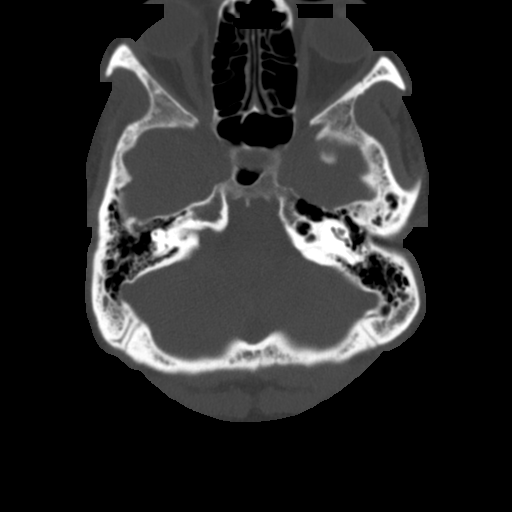

[Series 304: coronal, idose (2) · coronal · 0.34mm/px · 3 of 100 slices shown]
[im 25/100  brain]
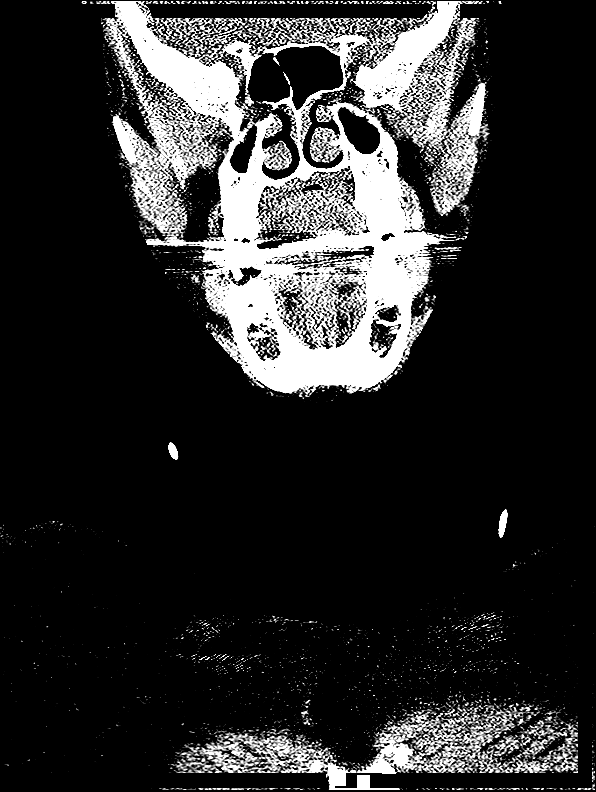
[im 50/100  brain]
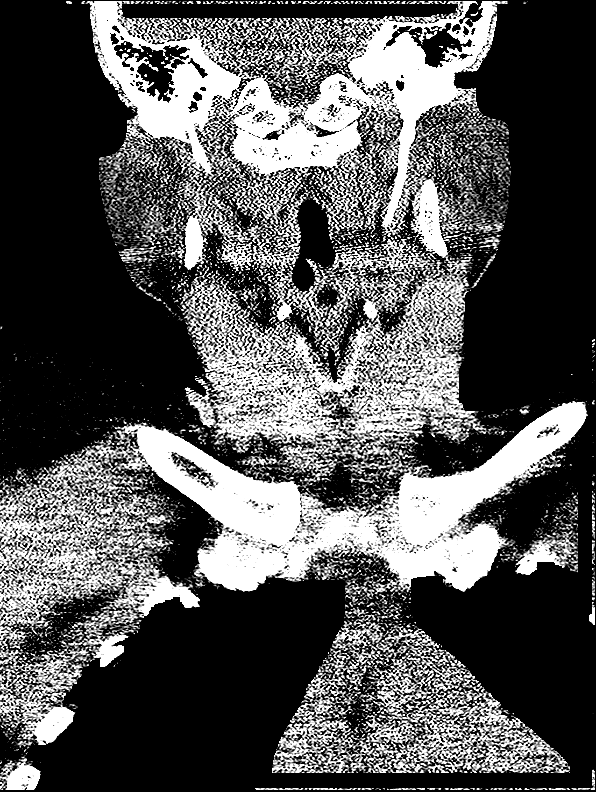
[im 75/100  brain]
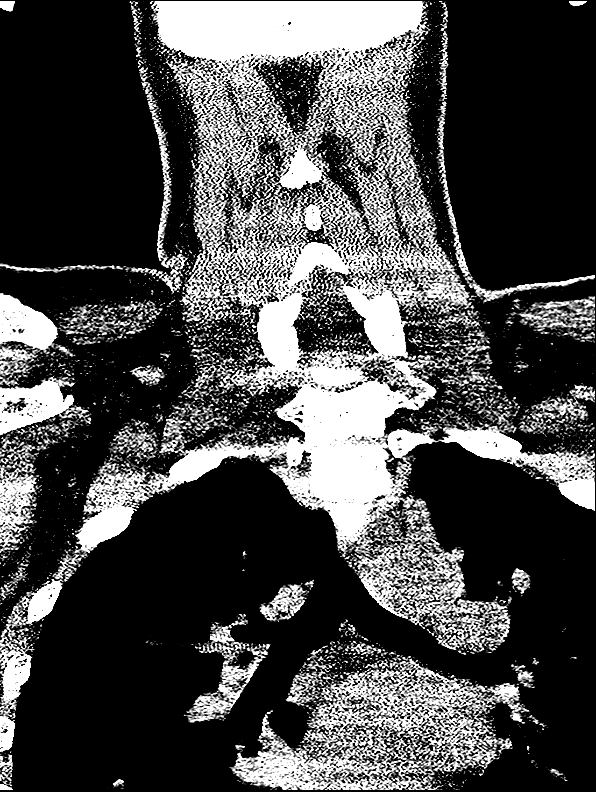

[Series 305: sagittal, idose (2) · sagittal · 0.34mm/px · 1 of 100 slices shown]
[im 50/100  brain]
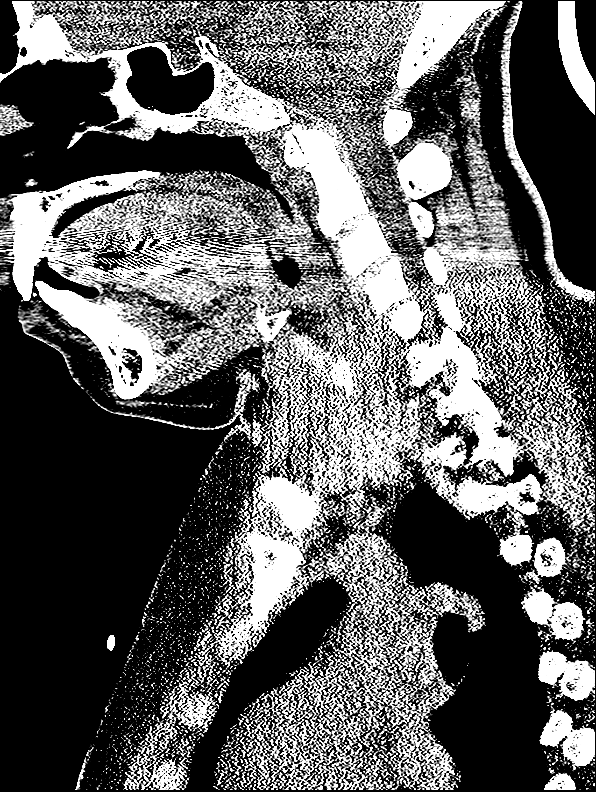

[13 of 47 positions shown; findings below may reference images not displayed]

FINDINGS: CT HEAD FINDINGS

The ventricles and the sulci are appropriate in size for the
patient's age. There is no intracranial hemorrhage. No midline shift
or mass effect identified. The gray-white matter differentiation is
preserved.

The visualized paranasal sinuses and mastoid air cells are well
aerated. The calvarium is intact.

CT CERVICAL SPINE FINDINGS

There is no acute fracture or subluxation of the cervical spine.The
intervertebral disc spaces are preserved.The odontoid and spinous
processes are intact.There is normal anatomic alignment of the C1-C2
lateral masses. The visualized soft tissues appear unremarkable.
IMPRESSION: No acute intracranial pathology.

No acute/ traumatic cervical spine pathology.

## 2017-09-10 ENCOUNTER — Ambulatory Visit: Payer: Medicaid Other | Admitting: Internal Medicine

## 2017-09-26 ENCOUNTER — Encounter: Payer: Self-pay | Admitting: Nurse Practitioner

## 2017-09-26 ENCOUNTER — Ambulatory Visit: Payer: Self-pay | Attending: Nurse Practitioner | Admitting: Nurse Practitioner

## 2017-09-26 VITALS — BP 120/86 | HR 103 | Temp 98.3°F | Ht 62.0 in | Wt 188.4 lb

## 2017-09-26 DIAGNOSIS — R899 Unspecified abnormal finding in specimens from other organs, systems and tissues: Secondary | ICD-10-CM | POA: Insufficient documentation

## 2017-09-26 DIAGNOSIS — E669 Obesity, unspecified: Secondary | ICD-10-CM | POA: Insufficient documentation

## 2017-09-26 DIAGNOSIS — M5441 Lumbago with sciatica, right side: Secondary | ICD-10-CM | POA: Insufficient documentation

## 2017-09-26 DIAGNOSIS — E876 Hypokalemia: Secondary | ICD-10-CM | POA: Insufficient documentation

## 2017-09-26 DIAGNOSIS — Z9884 Bariatric surgery status: Secondary | ICD-10-CM | POA: Insufficient documentation

## 2017-09-26 DIAGNOSIS — E559 Vitamin D deficiency, unspecified: Secondary | ICD-10-CM | POA: Insufficient documentation

## 2017-09-26 DIAGNOSIS — M5442 Lumbago with sciatica, left side: Secondary | ICD-10-CM | POA: Insufficient documentation

## 2017-09-26 DIAGNOSIS — Z79899 Other long term (current) drug therapy: Secondary | ICD-10-CM | POA: Insufficient documentation

## 2017-09-26 DIAGNOSIS — K219 Gastro-esophageal reflux disease without esophagitis: Secondary | ICD-10-CM | POA: Insufficient documentation

## 2017-09-26 DIAGNOSIS — M5440 Lumbago with sciatica, unspecified side: Secondary | ICD-10-CM
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
## 2017-09-26 NOTE — Patient Instructions (Addendum)

## 2017-09-27 ENCOUNTER — Encounter: Payer: Self-pay | Admitting: Nurse Practitioner

## 2017-09-27 LAB — CMP14+EGFR
A/G RATIO: 1.3 (ref 1.2–2.2)
ALT: 22 IU/L (ref 0–32)
AST: 21 IU/L (ref 0–40)
Albumin: 4.4 g/dL (ref 3.5–5.5)
Alkaline Phosphatase: 163 IU/L — ABNORMAL HIGH (ref 39–117)
BILIRUBIN TOTAL: 0.2 mg/dL (ref 0.0–1.2)
BUN/Creatinine Ratio: 10 (ref 9–23)
BUN: 8 mg/dL (ref 6–24)
CHLORIDE: 100 mmol/L (ref 96–106)
CO2: 24 mmol/L (ref 20–29)
Calcium: 9.4 mg/dL (ref 8.7–10.2)
Creatinine, Ser: 0.83 mg/dL (ref 0.57–1.00)
GFR calc Af Amer: 100 mL/min/{1.73_m2} (ref >59)
GFR calc non Af Amer: 87 mL/min/{1.73_m2} (ref >59)
Globulin, Total: 3.4 g/dL (ref 1.5–4.5)
Glucose: 85 mg/dL (ref 65–99)
POTASSIUM: 4.5 mmol/L (ref 3.5–5.2)
Sodium: 137 mmol/L (ref 134–144)
Total Protein: 7.8 g/dL (ref 6.0–8.5)

## 2017-09-27 LAB — CBC
Hematocrit: 35.2 % (ref 34.0–46.6)
Hemoglobin: 10.2 g/dL — ABNORMAL LOW (ref 11.1–15.9)
MCH: 22.3 pg — ABNORMAL LOW (ref 26.6–33.0)
MCHC: 29 g/dL — AB (ref 31.5–35.7)
MCV: 77 fL — ABNORMAL LOW (ref 79–97)
Platelets: 490 10*3/uL — ABNORMAL HIGH (ref 150–379)
RBC: 4.58 x10E6/uL (ref 3.77–5.28)
RDW: 21 % — ABNORMAL HIGH (ref 12.3–15.4)
WBC: 8 10*3/uL (ref 3.4–10.8)

## 2017-09-27 LAB — VITAMIN D 25 HYDROXY (VIT D DEFICIENCY, FRACTURES): VIT D 25 HYDROXY: 11.6 ng/mL — AB (ref 30.0–100.0)

## 2017-10-07 ENCOUNTER — Other Ambulatory Visit: Payer: Self-pay | Admitting: Nurse Practitioner

## 2017-10-07 MED ORDER — FERROUS SULFATE 325 (65 FE) MG PO TABS: 325.0000 mg | ORAL_TABLET | Freq: Every day | ORAL | 3 refills | Status: AC

## 2017-10-07 MED ORDER — VITAMIN D (ERGOCALCIFEROL) 1.25 MG (50000 UNIT) PO CAPS: 50000.0000 [IU] | ORAL_CAPSULE | ORAL | 1 refills | Status: AC

## 2017-10-09 ENCOUNTER — Telehealth: Payer: Self-pay | Admitting: *Deleted

## 2017-10-09 NOTE — Telephone Encounter (Signed)
Notes recorded by Guy FrancoBenjamin, Eriberto Felch, RN on 10/09/2017 at 10:03 AM EDT Attempt to call patient about lab results, Unable to reach patient, voicemail box not set up. ------  Notes recorded by Claiborne RiggFleming, Zelda W, NP on 10/07/2017 at 7:11 PM EDT Your labs are still showing anemia. This could be related to your menstrual cycles. I would recommend that you take an iron tablet 325mg  every other day to help increase your iron levels.You can take it with orange juice between meals for better absorption.  I could also refer you to gynecology for further recommendations regarding your menstrual cycles if you would like.  Your vitamin D levels are low. A prescription for weekly vitamin d has been sent to the pharmacy.

## 2017-10-09 NOTE — Telephone Encounter (Signed)
CMA attempt to call patient.  No answer and Unable to leave a Vm due to no Mailbox has been set up.  Letter will be send out to patient.  If patient call back, please inform:  Your labs are still showing anemia. This could be related to your menstrual cycles. I would recommend that you take an iron tablet 325mg  every other day to help increase your iron levels.You can take it with orange juice between meals for better absorption.   I could also refer you to gynecology for further recommendations regarding your menstrual cycles if you would like.  Your vitamin D levels are low. A prescription for weekly vitamin d has been sent to the pharmacy.

## 2017-10-09 NOTE — Telephone Encounter (Deleted)
-----   Message from Claiborne RiggZelda W Fleming, NP sent at 10/07/2017  7:11 PM EDT ----- Your labs are still showing anemia. This could be related to your menstrual cycles. I would recommend that you take an iron tablet 325mg  every other day to help increase your iron levels.You can take it with orange juice between meals for better absorption.   I could also refer you to gynecology for further recommendations regarding your menstrual cycles if you would like.  Your vitamin D levels are low. A prescription for weekly vitamin d has been sent to the pharmacy.

## 2017-10-15 ENCOUNTER — Other Ambulatory Visit: Payer: Self-pay | Admitting: Nurse Practitioner

## 2017-10-15 ENCOUNTER — Telehealth: Payer: Self-pay | Admitting: General Practice

## 2017-10-15 DIAGNOSIS — N939 Abnormal uterine and vaginal bleeding, unspecified: Secondary | ICD-10-CM

## 2017-10-15 NOTE — Telephone Encounter (Signed)
Will route to PCP 

## 2017-10-15 NOTE — Telephone Encounter (Signed)
Patient called and was informed/verbalized understanding of lab results. Patient did request for the referral to be placed to the gynecologist.

## 2017-10-15 NOTE — Telephone Encounter (Signed)
Referral placed.

## 2017-11-01 ENCOUNTER — Telehealth: Payer: Self-pay | Admitting: Nurse Practitioner

## 2017-11-01 NOTE — Telephone Encounter (Signed)
Pt called to get information about her medical records request, we try to help her and ask her if she went to the new pcp or did she came to the office to signed for the request, Pt got upset since she said for me to do my job and get her records, I told her that am trying to help her but I need to know is the request was fax to us or did she come to the office to signed the request and if anything those request will be done by 11/02/17 and the later will be done by 11/03/17, she again told me to do my job that im here for and to do not be a racists and I told one one my co-worker to handel the call because I can not help this Pt.  Pt hang up on my co-worker too

## 2017-11-17 ENCOUNTER — Other Ambulatory Visit: Payer: Self-pay

## 2017-11-17 ENCOUNTER — Emergency Department (HOSPITAL_COMMUNITY)
Admission: EM | Admit: 2017-11-17 | Discharge: 2017-11-18 | Disposition: A | Discharge status: Home / Self Care | Payer: Medicaid Other | Attending: Emergency Medicine | Admitting: Emergency Medicine

## 2017-11-17 DIAGNOSIS — Z79899 Other long term (current) drug therapy: Secondary | ICD-10-CM | POA: Insufficient documentation

## 2017-11-17 DIAGNOSIS — T675XXA Heat exhaustion, unspecified, initial encounter: Secondary | ICD-10-CM

## 2017-11-17 DIAGNOSIS — R55 Syncope and collapse: Secondary | ICD-10-CM | POA: Insufficient documentation

## 2017-11-17 LAB — I-STAT TROPONIN, ED: TROPONIN I, POC: 0 ng/mL (ref 0.00–0.08)

## 2017-11-17 LAB — I-STAT CG4 LACTIC ACID, ED: Lactic Acid, Venous: 2.22 mmol/L (ref 0.5–1.9)

## 2017-11-17 LAB — CBG MONITORING, ED: Glucose-Capillary: 110 mg/dL — ABNORMAL HIGH (ref 65–99)

## 2017-11-17 LAB — I-STAT BETA HCG BLOOD, ED (MC, WL, AP ONLY): I-stat hCG, quantitative: <5 m[IU]/mL (ref <5)

## 2017-11-17 MED ORDER — DICYCLOMINE HCL 10 MG/ML IM SOLN
20.0000 mg | Freq: Once | INTRAMUSCULAR | Status: AC
  Administered 2017-11-17: 20 mg via INTRAMUSCULAR
  Filled 2017-11-17: qty 2

## 2017-11-17 MED ORDER — SODIUM CHLORIDE 0.9 % IV BOLUS
1000.0000 mL | Freq: Once | INTRAVENOUS | Status: AC
  Administered 2017-11-17: 1000 mL via INTRAVENOUS

## 2017-11-17 NOTE — ED Triage Notes (Signed)
Pt BIB EMS from home for syncope, hypotension, and abd pain. Pt reports cleaning the house this evening and feels as if she strained herself. Pt took 10 mg hydrocodone that she takes for her back pain (hx compression fx). While in bed pt felt dizzy and reports LOC. Pt now reports dizziness, abd pain, and tingling in her feet. No neuro deficits or weakness van neg. Pt hypotensive 83/50 on EMS arrival, received 500 cc NS PTA. Pt A&Ox4.

## 2017-11-18 LAB — BASIC METABOLIC PANEL
ANION GAP: 11 (ref 5–15)
BUN: 11 mg/dL (ref 6–20)
CHLORIDE: 100 mmol/L — AB (ref 101–111)
CO2: 22 mmol/L (ref 22–32)
Calcium: 7.8 mg/dL — ABNORMAL LOW (ref 8.9–10.3)
Creatinine, Ser: 0.82 mg/dL (ref 0.44–1.00)
GFR calc Af Amer: >60 mL/min (ref >60)
GLUCOSE: 107 mg/dL — AB (ref 65–99)
POTASSIUM: 3.5 mmol/L (ref 3.5–5.1)
Sodium: 133 mmol/L — ABNORMAL LOW (ref 135–145)

## 2017-11-18 LAB — URINALYSIS, ROUTINE W REFLEX MICROSCOPIC
Bilirubin Urine: NEGATIVE
GLUCOSE, UA: NEGATIVE mg/dL
HGB URINE DIPSTICK: NEGATIVE
KETONES UR: NEGATIVE mg/dL
LEUKOCYTES UA: NEGATIVE
NITRITE: NEGATIVE
PROTEIN: 30 mg/dL — AB
Specific Gravity, Urine: 1.011 (ref 1.005–1.030)
pH: 6 (ref 5.0–8.0)

## 2017-11-18 LAB — I-STAT CG4 LACTIC ACID, ED: LACTIC ACID, VENOUS: 0.92 mmol/L (ref 0.5–1.9)

## 2017-11-18 LAB — CBC
HEMATOCRIT: 30.9 % — AB (ref 36.0–46.0)
HEMOGLOBIN: 9.2 g/dL — AB (ref 12.0–15.0)
MCH: 23.8 pg — ABNORMAL LOW (ref 26.0–34.0)
MCHC: 29.8 g/dL — AB (ref 30.0–36.0)
MCV: 79.8 fL (ref 78.0–100.0)
Platelets: 360 10*3/uL (ref 150–400)
RBC: 3.87 MIL/uL (ref 3.87–5.11)
RDW: 19.2 % — ABNORMAL HIGH (ref 11.5–15.5)
WBC: 8.3 10*3/uL (ref 4.0–10.5)

## 2017-11-18 LAB — CK: CK TOTAL: 72 U/L (ref 38–234)

## 2017-11-18 LAB — LIPASE, BLOOD: Lipase: 32 U/L (ref 11–51)

## 2017-11-18 LAB — I-STAT TROPONIN, ED: TROPONIN I, POC: 0 ng/mL (ref 0.00–0.08)

## 2017-11-18 NOTE — ED Notes (Signed)
Called lab to verify that they received add on for CK, Rhea adding on now.
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
## 2017-11-18 NOTE — ED Notes (Signed)
Patient left at this time with all belongings. 

## 2017-11-18 NOTE — Discharge Instructions (Signed)
Drink plenty of fluids. Try to stay cool. Take iron pills for your anemia. Recheck if you get worse.

## 2017-12-18 ENCOUNTER — Encounter: Payer: Self-pay | Admitting: Obstetrics & Gynecology

## 2018-01-09 ENCOUNTER — Encounter: Payer: Medicaid Other | Admitting: Obstetrics & Gynecology

## 2018-01-17 ENCOUNTER — Encounter: Payer: Medicaid Other | Admitting: Obstetrics & Gynecology

## 2018-01-17 NOTE — Progress Notes (Deleted)
   Patient did not show up today for her scheduled appointment.   Deborah Ekstrand, MD, FACOG Obstetrician & Gynecologist, [facility], [facility]  

## 2019-01-30 ENCOUNTER — Other Ambulatory Visit: Payer: Self-pay

## 2019-01-30 DIAGNOSIS — Z20822 Contact with and (suspected) exposure to covid-19: Secondary | ICD-10-CM

## 2019-02-02 LAB — NOVEL CORONAVIRUS, NAA: SARS-CoV-2, NAA: NOT DETECTED

## 2019-02-07 ENCOUNTER — Other Ambulatory Visit: Payer: Self-pay

## 2019-02-07 DIAGNOSIS — Z20822 Contact with and (suspected) exposure to covid-19: Secondary | ICD-10-CM

## 2019-02-09 LAB — NOVEL CORONAVIRUS, NAA: SARS-CoV-2, NAA: NOT DETECTED

## 2019-08-13 ENCOUNTER — Other Ambulatory Visit: Payer: Self-pay

## 2019-08-13 ENCOUNTER — Encounter: Payer: Self-pay | Admitting: Nurse Practitioner

## 2019-08-13 ENCOUNTER — Ambulatory Visit: Payer: Self-pay | Attending: Nurse Practitioner | Admitting: Nurse Practitioner

## 2019-08-13 DIAGNOSIS — R55 Syncope and collapse: Secondary | ICD-10-CM

## 2019-08-13 NOTE — Progress Notes (Signed)
Virtual Visit via Telephone Note Due to national recommendations of social distancing due to [name] 19, telehealth visit is felt to be most appropriate for this patient at this time.  I discussed the limitations, risks, security and privacy concerns of performing an evaluation and management service by telephone and the availability of in person appointments. I also discussed with the patient that there may be a patient responsible charge related to this service. The patient expressed understanding and agreed to proceed.    I connected with Deborah Hogan on 08/13/19  at  10:50 AM EST  EDT by telephone and verified that I am speaking with the correct person using two identifiers.   Consent I discussed the limitations, risks, security and privacy concerns of performing an evaluation and management service by telephone and the availability of in person appointments. I also discussed with the patient that there may be a patient responsible charge related to this service. The patient expressed understanding and agreed to proceed.   Location of Patient: Private Residence   Location of Provider: [place] [facility] [place] participating in Telemedicine visit: [name]    History of Present Illness: Telemedicine visit for: Syncope  Syncope She has been evaluated in the ED on several occasions since 01-2016 for syncopal episodes. She denies any seizure activity. Previous EKG, Head CT and electrolytes have been negative. She does have a history of anemia. Syncope was favored over seizures at that time. She has not had an ECHO.  States she can tell when she is getting ready to pass out because sometimes her legs will feel numb, head feels full and her feet will tingle.  She reports the episodes have been witnessed by family members who all deny any seizure activity. Last occurrence around January 17th. She does not drink alcohol or  take illicit drugs She does periodically take opioids as needed and drinks wine but does not drink when she has taken pain medications. She has been taking iron for 3 months now.  She has anemia with menorrhagia. Has never had a pap smear and states she "does not get them". She declines being scheduled for a PAP.  Patient has been advised to apply for financial assistance and schedule to see our financial counselor.      Past Medical History:  Diagnosis Date  . Gas pain     Past Surgical History:  Procedure Laterality Date  . CHOLECYSTECTOMY    . GASTRIC BYPASS  2011    Family History  Problem Relation Age of Onset  . Diabetes Mother   . Cancer Mother   . Diabetes Father   . Cancer Father     Social History   Socioeconomic History  . Marital status: Married    Spouse name: Not on file  . Number of children: Not on file  . Years of education: Not on file  . Highest education level: Not on file  Occupational History  . Not on file  Tobacco Use  . Smoking status: Never Smoker  . Smokeless tobacco: Never Used  Substance and Sexual Activity  . Alcohol use: Yes    Comment: wine occ  . Drug use: No  . Sexual activity: Yes  Other Topics Concern  . Not on file  Social History Narrative  . Not on file   Social Determinants of Health   Financial Resource Strain:   . Difficulty of Paying Living Expenses: Not on file  Food Insecurity:   . Worried About Charity fundraiser in the Last Year: Not on file  . Ran Out of Food in the Last Year: Not on file  Transportation Needs:   . Lack of Transportation (Medical): Not on file  . Lack of Transportation (Non-Medical): Not on file  Physical Activity:   . Days of Exercise per Week: Not on file  . Minutes of Exercise per Session: Not on file  Stress:   . Feeling of Stress : Not on file  Social Connections:   . Frequency of Communication with Friends and Family: Not on file  . Frequency of Social Gatherings with Friends and  Family: Not on file  . Attends Religious Services: Not on file  . Active Member of Clubs or Organizations: Not on file  . Attends Archivist Meetings: Not on file  . Marital Status: Not on file     Observations/Objective: Awake, alert and oriented x 3   Review of Systems  Constitutional: Negative for fever, malaise/fatigue and weight loss.  HENT: Negative.  Negative for nosebleeds.   Eyes: Negative.  Negative for blurred vision, double vision and photophobia.  Respiratory: Negative.  Negative for cough and shortness of breath.   Cardiovascular: Negative.  Negative for chest pain, palpitations and leg swelling.  Gastrointestinal: Positive for heartburn. Negative for nausea and vomiting.  Musculoskeletal: Negative.  Negative for myalgias.  Neurological: Negative for dizziness, focal weakness, seizures and headaches.       Syncope  Psychiatric/Behavioral: Negative.  Negative for suicidal ideas.    Assessment and Plan: [name] was seen today for loss of consciousness.  Diagnoses and all orders for this visit:  Syncope, unspecified syncope type -     ECHOCARDIOGRAM COMPLETE; Future -     Ambulatory referral to Cardiology -     CBC; Future -     CMP14+EGFR; Future -     Lipid panel; Future -     TSH; Future -     Hemoglobin A1c; Future     Follow Up Instructions Return in about 3 months (around 11/10/2019).     I discussed the assessment and treatment plan with the patient. The patient was provided an opportunity to ask questions and all were answered. The patient agreed with the plan and demonstrated an understanding of the instructions.   The patient was advised to call back or seek an in-person evaluation if the symptoms worsen or if the condition fails to improve as anticipated.  I provided 20 minutes of non-face-to-face time during this encounter including median intraservice time, reviewing previous notes, labs, imaging, medications and explaining diagnosis and  management.  [name], FNP-BC

## 2019-08-16 ENCOUNTER — Encounter: Payer: Self-pay | Admitting: Nurse Practitioner

## 2019-08-18 ENCOUNTER — Other Ambulatory Visit: Payer: Medicaid Other

## 2019-08-25 ENCOUNTER — Ambulatory Visit (HOSPITAL_COMMUNITY): Admission: RE | Admit: 2019-08-25 | Payer: Medicaid Other | Source: Ambulatory Visit

## 2019-08-26 ENCOUNTER — Encounter: Payer: Self-pay | Admitting: Nurse Practitioner
